# Patient Record
Sex: Male | Born: 1995 | Race: Black or African American | Hispanic: No | Marital: Single | Smoking: Never smoker
Health system: Southern US, Community
[De-identification: ages and names within clinical notes are randomized; demographics above are authoritative.]

## PROBLEM LIST (undated history)

## (undated) DIAGNOSIS — R7689 Other specified abnormal immunological findings in serum: Secondary | ICD-10-CM

## (undated) DIAGNOSIS — R768 Other specified abnormal immunological findings in serum: Secondary | ICD-10-CM

## (undated) DIAGNOSIS — R1013 Epigastric pain: Secondary | ICD-10-CM

## (undated) DIAGNOSIS — R3589 Other polyuria: Secondary | ICD-10-CM

## (undated) DIAGNOSIS — K219 Gastro-esophageal reflux disease without esophagitis: Secondary | ICD-10-CM

## (undated) DIAGNOSIS — I499 Cardiac arrhythmia, unspecified: Secondary | ICD-10-CM

## (undated) DIAGNOSIS — I10 Essential (primary) hypertension: Secondary | ICD-10-CM

## (undated) DIAGNOSIS — B009 Herpesviral infection, unspecified: Secondary | ICD-10-CM

## (undated) HISTORY — DX: Epigastric pain: R10.13

## (undated) HISTORY — DX: Essential (primary) hypertension: I10

## (undated) HISTORY — DX: Cardiac arrhythmia, unspecified: I49.9

## (undated) HISTORY — DX: Other polyuria: R35.89

## (undated) HISTORY — DX: Gastro-esophageal reflux disease without esophagitis: K21.9

## (undated) HISTORY — DX: Herpesviral infection, unspecified: B00.9

## (undated) HISTORY — DX: Other specified abnormal immunological findings in serum: R76.8

## (undated) HISTORY — DX: Other specified abnormal immunological findings in serum: R76.89

---
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
## 2019-03-14 ENCOUNTER — Other Ambulatory Visit: Payer: Self-pay

## 2019-03-14 DIAGNOSIS — Z20822 Contact with and (suspected) exposure to covid-19: Secondary | ICD-10-CM

## 2019-03-16 LAB — NOVEL CORONAVIRUS, NAA: SARS-CoV-2, NAA: NOT DETECTED

## 2019-06-05 ENCOUNTER — Ambulatory Visit: Payer: Medicare HMO | Attending: Internal Medicine

## 2019-06-05 ENCOUNTER — Other Ambulatory Visit: Payer: Self-pay

## 2019-06-05 DIAGNOSIS — Z20822 Contact with and (suspected) exposure to covid-19: Secondary | ICD-10-CM

## 2019-06-06 LAB — NOVEL CORONAVIRUS, NAA: SARS-CoV-2, NAA: NOT DETECTED

## 2020-11-13 ENCOUNTER — Other Ambulatory Visit: Payer: Self-pay

## 2020-11-13 ENCOUNTER — Emergency Department (HOSPITAL_COMMUNITY)
Admission: EM | Admit: 2020-11-13 | Discharge: 2020-11-13 | Disposition: A | Discharge status: Home / Self Care | Payer: PRIVATE HEALTH INSURANCE | Attending: Emergency Medicine | Admitting: Emergency Medicine

## 2020-11-13 ENCOUNTER — Encounter (HOSPITAL_COMMUNITY): Payer: Self-pay | Admitting: Emergency Medicine

## 2020-11-13 DIAGNOSIS — R002 Palpitations: Secondary | ICD-10-CM | POA: Diagnosis present

## 2020-11-13 DIAGNOSIS — I493 Ventricular premature depolarization: Secondary | ICD-10-CM | POA: Diagnosis not present

## 2020-11-13 DIAGNOSIS — D72819 Decreased white blood cell count, unspecified: Secondary | ICD-10-CM | POA: Diagnosis not present

## 2020-11-13 LAB — CBC WITH DIFFERENTIAL/PLATELET
Abs Immature Granulocytes: 0 10*3/uL (ref 0.00–0.07)
Basophils Absolute: 0 10*3/uL (ref 0.0–0.1)
Basophils Relative: 1 %
Eosinophils Absolute: 0.1 10*3/uL (ref 0.0–0.5)
Eosinophils Relative: 3 %
HCT: 44.3 % (ref 39.0–52.0)
Hemoglobin: 14.8 g/dL (ref 13.0–17.0)
Immature Granulocytes: 0 %
Lymphocytes Relative: 40 %
Lymphs Abs: 1.2 10*3/uL (ref 0.7–4.0)
MCH: 28.5 pg (ref 26.0–34.0)
MCHC: 33.4 g/dL (ref 30.0–36.0)
MCV: 85.2 fL (ref 80.0–100.0)
Monocytes Absolute: 0.4 10*3/uL (ref 0.1–1.0)
Monocytes Relative: 15 %
Neutro Abs: 1.3 10*3/uL — ABNORMAL LOW (ref 1.7–7.7)
Neutrophils Relative %: 41 %
Platelets: 148 10*3/uL — ABNORMAL LOW (ref 150–400)
RBC: 5.2 MIL/uL (ref 4.22–5.81)
RDW: 11.9 % (ref 11.5–15.5)
WBC: 2.9 10*3/uL — ABNORMAL LOW (ref 4.0–10.5)
nRBC: 0 % (ref 0.0–0.2)

## 2020-11-13 LAB — COMPREHENSIVE METABOLIC PANEL
ALT: 24 U/L (ref 0–44)
AST: 21 U/L (ref 15–41)
Albumin: 4.4 g/dL (ref 3.5–5.0)
Alkaline Phosphatase: 78 U/L (ref 38–126)
Anion gap: 5 (ref 5–15)
BUN: 10 mg/dL (ref 6–20)
CO2: 29 mmol/L (ref 22–32)
Calcium: 9 mg/dL (ref 8.9–10.3)
Chloride: 103 mmol/L (ref 98–111)
Creatinine, Ser: 1.07 mg/dL (ref 0.61–1.24)
GFR, Estimated: >60 mL/min (ref >60)
Glucose, Bld: 88 mg/dL (ref 70–99)
Potassium: 3.9 mmol/L (ref 3.5–5.1)
Sodium: 137 mmol/L (ref 135–145)
Total Bilirubin: 1 mg/dL (ref 0.3–1.2)
Total Protein: 7.5 g/dL (ref 6.5–8.1)

## 2020-11-13 LAB — MAGNESIUM: Magnesium: 2.1 mg/dL (ref 1.7–2.4)

## 2020-11-13 NOTE — Discharge Instructions (Addendum)
Your testing has not revealed any specific abnormalities other than a slightly low white blood cell count, this measured at 2900 which is slightly low.  I would recommend that you have this followed up within the next 2 weeks.  If you should develop worsening palpitations chest pain shortness of breath or weakness return to the emergency department  Make sure you are drinking at least 64 ounces of water per day

## 2020-11-13 NOTE — ED Triage Notes (Signed)
Pt c/o having palpitations today. Also c/o that his extremities fell cold and having some bilateral numbness.

## 2020-11-13 NOTE — ED Provider Notes (Signed)
[facility] EMERGENCY DEPARTMENT Provider Note   CSN: [id number] Arrival date & time: 11/13/20  2143     History Chief Complaint  Patient presents with   Palpitations    Peter Durham is a 25 y.o. male.   Palpitations  This patient is a 25 year old male, he has no prior medical history, he was in his usual state of health when he felt like his heart was having some high palpitations.  The patient denies having anything like this in the past, he states that he plays soccer once a week or so and never has any issues with that other than feeling like he sweats very heavily.  He drinks approximately half of a gallon of water per day, he does not use any caffeine, does not use any drugs, does not take any over-the-counter or prescription medications and has never had any issues with his heart, according to the father who is an additional historian and a physician there is no history of prior early coronary disease or sudden cardiac death in the family.  This patient denies any swelling of the legs, fever, chills, nausea, vomiting, diarrhea, he has not had any unexpected weight loss weight gain or changes in his hair.  History reviewed. No pertinent past medical history.  There are no problems to display for this patient.   History reviewed. No pertinent surgical history.     Family History  Problem Relation Age of Onset   Hypertension Father    Hypertension Paternal Grandmother     Social History   Tobacco Use   Smoking status: Never   Smokeless tobacco: Never  Substance Use Topics   Alcohol use: No   Drug use: No    Home Medications Prior to Admission medications   Not on File    Allergies    Patient has no known allergies.  Review of Systems   Review of Systems  Cardiovascular:  Positive for palpitations.  All other systems reviewed and are negative.  Physical Exam Updated Vital Signs BP 123/77   Pulse (!) 59   Temp 98.5 F (36.9 C) (Oral)   Resp 16   Ht  1.803 m (5\' 11" )   Wt 86.5 kg   SpO2 100%   BMI 26.60 kg/m   Physical Exam Vitals and nursing note reviewed.  Constitutional:      General: He is not in acute distress.    Appearance: He is well-developed.  HENT:     Head: Normocephalic and atraumatic.     Mouth/Throat:     Pharynx: No oropharyngeal exudate.  Eyes:     General: No scleral icterus.       Right eye: No discharge.        Left eye: No discharge.     Conjunctiva/sclera: Conjunctivae normal.     Pupils: Pupils are equal, round, and reactive to light.  Neck:     Thyroid: No thyromegaly.     Vascular: No JVD.  Cardiovascular:     Rate and Rhythm: Normal rate and regular rhythm.     Heart sounds: Normal heart sounds. No murmur heard.   No friction rub. No gallop.     Comments: Occasional ectopic beat which correlates with a PVC on the monitor Pulmonary:     Effort: Pulmonary effort is normal. No respiratory distress.     Breath sounds: Normal breath sounds. No wheezing or rales.  Abdominal:     General: Bowel sounds are normal. There is no distension.  Palpations: Abdomen is soft. There is no mass.     Tenderness: There is no abdominal tenderness.  Musculoskeletal:        General: No tenderness. Normal range of motion.     Cervical back: Normal range of motion and neck supple.  Lymphadenopathy:     Cervical: No cervical adenopathy.  Skin:    General: Skin is warm and dry.     Findings: No erythema or rash.  Neurological:     General: No focal deficit present.     Mental Status: He is alert.     Coordination: Coordination normal.     Comments: Normal reflexes at the patellar tendons, normal strength in all 4 extremities, normal speech, normal coordination, normal gait  Psychiatric:        Behavior: Behavior normal.    ED Results / Procedures / Treatments   Labs (all labs ordered are listed, but only abnormal results are displayed) Labs Reviewed  CBC WITH DIFFERENTIAL/PLATELET - Abnormal; Notable for  the following components:      Result Value   WBC 2.9 (*)    Platelets 148 (*)    Neutro Abs 1.3 (*)    All other components within normal limits  COMPREHENSIVE METABOLIC PANEL  MAGNESIUM  TSH    EKG EKG Interpretation  Date/Time:  Thursday November 13 2020 21:59:33 EDT Ventricular Rate:  57 PR Interval:  139 QRS Duration: 102 QT Interval:  401 QTC Calculation: 391 R Axis:   84 Text Interpretation: Sinus rhythm ST elev, probable normal early repol pattern No old tracing to compare Confirmed by Eber Hong ([id number]) on 11/13/2020 10:12:13 PM  Radiology No results found.  Procedures Procedures   Medications Ordered in ED Medications - No data to display  ED Course  I have reviewed the triage vital signs and the nursing notes.  Pertinent labs & imaging results that were available during my care of the patient were reviewed by me and considered in my medical decision making (see chart for details).    MDM Rules/Calculators/A&P                          Labs have been ordered including a TSH, metabolic panel, CBC to rule out anemia low electrolytes or thyroid abnormalities.  His EKG is reassuring and shows a sinus rhythm without any signs of ischemia or arrhythmia.  It is clear that he is having occasional PVCs on the monitor which she feels as we talked.  He states this is what he has been feeling.  The patient has a rather normal work-up other than a mild leukopenia.  He has been informed of this and will need to have this followed up.  Vital signs otherwise unremarkable, TSH pending at discharge, patient aware that this can be followed up in the outpatient setting, agreeable  No pathological arrhythmias have been seen on the monitor  Final Clinical Impression(s) / ED Diagnoses Final diagnoses:  Palpitations  Leukopenia, unspecified type  PVC's (premature ventricular contractions)    Rx / DC Orders ED Discharge Orders     None        Eber Hong, MD 11/13/20  2311

## 2020-11-14 LAB — TSH: TSH: 3.487 u[IU]/mL (ref 0.350–4.500)

## 2021-01-07 DIAGNOSIS — D709 Neutropenia, unspecified: Secondary | ICD-10-CM | POA: Insufficient documentation

## 2021-03-17 DIAGNOSIS — R350 Frequency of micturition: Secondary | ICD-10-CM | POA: Diagnosis not present

## 2021-05-18 DIAGNOSIS — R7989 Other specified abnormal findings of blood chemistry: Secondary | ICD-10-CM | POA: Diagnosis not present

## 2021-05-18 DIAGNOSIS — R350 Frequency of micturition: Secondary | ICD-10-CM | POA: Diagnosis not present

## 2021-05-21 ENCOUNTER — Telehealth: Payer: Self-pay | Admitting: "Endocrinology

## 2021-05-21 NOTE — Telephone Encounter (Signed)
Called pt to schedule a new pt appt with Dr Fransico Him. He is on break for the next 3-4 weeks per the staff message that was sent to me from Dr Fransico Him. His VM was full so I was unable to leave a VM

## 2021-06-23 ENCOUNTER — Ambulatory Visit (INDEPENDENT_AMBULATORY_CARE_PROVIDER_SITE_OTHER): Payer: Self-pay | Admitting: "Endocrinology

## 2021-06-23 ENCOUNTER — Other Ambulatory Visit: Payer: Self-pay

## 2021-06-23 ENCOUNTER — Encounter: Payer: Self-pay | Admitting: "Endocrinology

## 2021-06-23 ENCOUNTER — Telehealth: Payer: Self-pay | Admitting: "Endocrinology

## 2021-06-23 VITALS — BP 114/72 | HR 60 | Ht 71.5 in | Wt 210.0 lb

## 2021-06-23 DIAGNOSIS — Z87898 Personal history of other specified conditions: Secondary | ICD-10-CM

## 2021-06-23 NOTE — Progress Notes (Signed)
Endocrinology Consult Note                                            06/23/2021, 12:44 PM   Subjective:    Patient ID: Peter Durham, male    DOB: Jun 05, 1996, PCP Carrico, Lynann Bologna, NP   History reviewed. No pertinent past medical history. History reviewed. No pertinent surgical history. Social History   Socioeconomic History   Marital status: Single    Spouse name: Not on file   Number of children: Not on file   Years of education: Not on file   Highest education level: Not on file  Occupational History   Not on file  Tobacco Use   Smoking status: Never   Smokeless tobacco: Never  Vaping Use   Vaping Use: Never used  Substance and Sexual Activity   Alcohol use: No   Drug use: No   Sexual activity: Never    Birth control/protection: Abstinence  Other Topics Concern   Not on file  Social History Narrative   Not on file   Social Determinants of Health   Financial Resource Strain: Not on file  Food Insecurity: Not on file  Transportation Needs: Not on file  Physical Activity: Not on file  Stress: Not on file  Social Connections: Not on file   Family History  Problem Relation Age of Onset   Thyroid disease Mother    Hypertension Father    Hypertension Paternal Grandmother    No outpatient encounter medications on file as of 06/23/2021.   No facility-administered encounter medications on file as of 06/23/2021.   ALLERGIES: No Known Allergies  VACCINATION STATUS: Immunization History  Administered Date(s) Administered   DTaP 01/20/1996, 03/16/1996, 07/03/1996, 02/22/1997, 01/17/2002   Hepatitis B 12/01/1995, 07/03/1996   HiB (PRP-OMP) 01/20/1996, 03/16/1996, 07/03/1996, 02/22/1997   IPV 01/20/1996, 03/16/1996, 02/22/1997   MMR 02/22/1997   Meningococcal Conjugate 02/28/2008, 12/05/2013   Meningococcal Polysaccharide 02/28/2008   Moderna Sars-Covid-2 Vaccination 10/03/2019, 11/03/2019   Tdap 01/11/2007   Varicella 10/19/1996    HPI Peter Durham is 26 y.o. male who presents today with a medical history as above. he is being seen in consultation for polyuria requested by Peter Sickle, NP.    History is obtained directly from the patient as well as chart review.  He reports that around June - July 2022 he was experiencing frequent urination associated with polydipsia.  At peak times he was drinking up to 6-8 liters a day with urinating anywhere from  every 30 to 60 minutes. Subsequently, he adjusted his diet by removing sweeteners, coffee, as well as chocolate and he saw that his symptoms have improved.  Currently he drinks and urinates up to 2 L a day.  He never had documented hyponatremia.  He denies any history of head injury or exposure to lithium. 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 He does not have diabetes mellitus.  He is not on medications on regular basis.  He is a non-smoker.  No use of illicit drugs.  He denies any other chronic medical problems such as liver disease nor kidney disease.  He noticed recent increase in his body weight by more than 10 pounds since summer 2022.    Review of Systems  Constitutional: + Recent weight gain,  no fatigue, no subjective hyperthermia, no subjective  hypothermia Eyes: no blurry vision, no xerophthalmia ENT: no sore throat, no nodules palpated in throat, no dysphagia/odynophagia, no hoarseness Cardiovascular: no Chest Pain, no Shortness of Breath, no palpitations, no leg swelling Respiratory: no cough, no shortness of breath Gastrointestinal: no Nausea/Vomiting/Diarhhea Musculoskeletal: no muscle/joint aches Skin: no rashes Neurological: no tremors, no numbness, no tingling, no dizziness Psychiatric: no depression, no anxiety  Objective:    Vitals with BMI 06/23/2021 11/13/2020 11/13/2020  Height 5' 11.5" - -  Weight 210 lbs - -  BMI 34.28 - -  Systolic 768 115 726  Diastolic 72 77 79  Pulse 60 59 55    BP 114/72    Pulse 60    Ht 5'  11.5" (1.816 m)    Wt 210 lb (95.3 kg)    BMI 28.88 kg/m   Wt Readings from Last 3 Encounters:  06/23/21 210 lb (95.3 kg)  11/13/20 190 lb 11.2 oz (86.5 kg)  12/05/13 171 lb (77.6 kg) (79 %, Z= 0.80)*   * Growth percentiles are based on CDC (Boys, 2-20 Years) data.    Physical Exam  Constitutional:  Body mass index is 28.88 kg/m.,  not in acute distress, normal state of mind Eyes: PERRLA, EOMI, no exophthalmos ENT: moist mucous membranes, no gross thyromegaly, no gross cervical lymphadenopathy  Musculoskeletal: no gross deformities, strength intact in all four extremities Skin: moist, warm, no rashes Neurological: no tremor with outstretched hands, Deep tendon reflexes normal in bilateral lower extremities.  CMP ( most recent) CMP     Component Value Date/Time   NA 137 11/13/2020 2235   K 3.9 11/13/2020 2235   CL 103 11/13/2020 2235   CO2 29 11/13/2020 2235   GLUCOSE 88 11/13/2020 2235   BUN 10 11/13/2020 2235   CREATININE 1.07 11/13/2020 2235   CALCIUM 9.0 11/13/2020 2235   PROT 7.5 11/13/2020 2235   ALBUMIN 4.4 11/13/2020 2235   AST 21 11/13/2020 2235   ALT 24 11/13/2020 2235   ALKPHOS 78 11/13/2020 2235   BILITOT 1.0 11/13/2020 2235   GFRNONAA >60 11/13/2020 2235       Lab Results  Component Value Date   TSH 3.487 11/13/2020           Assessment & Plan:   1. History of polyuria  - Peter Durham  is being seen at a kind request of Carrico, Lynann Bologna, NP. - I have reviewed his available  records and clinically evaluated the patient. - Based on these reviews, he has history of polyuria associated with polydipsia however,  there is not sufficient information to proceed with definitive treatment plan.  He does not have documented hyponatremia.  His urine output currently is estimated to be up to 2 L a day and he is drinking as much.  - he will need a repeat,  more complete labs towards making a diagnosis.  At this time, it appears that he has experienced  solute diuresis as opposed to water diuresis during last summer which has responded to his modification of his diet  with less sugar, chocolate, caffeine.    Unlikely that he has central DI, however he will have urine electrolytes, serum electrolytes, urine osmolality, serum osmolality.  He is not a suspect for nephrogenic DI either  - he acknowledges that there is a room for improvement in his food and drink choices. - Suggestion is made for him to avoid simple carbohydrates  from his diet including Cakes, Sweet Desserts, Ice Cream, Soda (diet and regular),  Sweet Tea, Candies, Chips, Cookies, Store Bought Juices, Alcohol , Artificial Sweeteners,  Coffee Creamer, and "Sugar-free" Products, Lemonade. This will help patient to have more stable body weight and avoid unintended weight gain.     Whole Foods, Plant-Based Nutrition comprising of fruits and vegetables, plant-based proteins, whole-grain carbohydrates was discussed in detail with the patient.   A list for source of those nutrients were also provided to the patient.  Patient will use only water or unsweetened tea for hydration.  Optimal exercise was discussed with the patient .  - I did not initiate any new prescriptions today. - he is advised to maintain close follow up with Carrico, Lynann Bologna, NP for primary care needs.   - Time spent with the patient: 50 minutes, of which >50% was spent in  counseling him about his polyuria and the rest in obtaining information about his symptoms, reviewing his previous labs/studies ( including abstractions from other facilities),  evaluations, and treatments,  and developing a plan to confirm diagnosis and long term treatment based on the latest standards of care/guidelines; and documenting his care.  Peter Durham participated in the discussions, expressed understanding, and voiced agreement with the above plans.  All questions were answered to his satisfaction. he is encouraged to contact clinic should he  have any questions or concerns prior to his return visit.  Follow up plan: Return in about 2 weeks (around 07/07/2021) for F/U with Pre-visit Labs.   Glade Lloyd, MD [facility] [address] [place], [place] [address] Phone: [phone]  Fax: [phone]     06/23/2021, 12:44 PM  This note was partially dictated with voice recognition software. Similar sounding words can be transcribed inadequately or may not  be corrected upon review.

## 2021-06-23 NOTE — Telephone Encounter (Signed)
Pt had a first time visit today with Dr Fransico Him. The insurance he provided me was [employer]. I ran this insurance several times with the ID numbers that he provided to me. It continuously said inactive. I made patient aware. Patient made it known to me that online it said his insurance was active. I tried to call pt's insurance and was unable to get through, long wait time. Patient was advised to call them. He called [place] around [phone] to let us know they said insurance was active. We are not able to add the insurance being " inactive ". Patient made aware.

## 2021-06-30 ENCOUNTER — Other Ambulatory Visit: Payer: Self-pay | Admitting: "Endocrinology

## 2021-06-30 DIAGNOSIS — Z87898 Personal history of other specified conditions: Secondary | ICD-10-CM | POA: Diagnosis not present

## 2021-07-03 LAB — COMPREHENSIVE METABOLIC PANEL
ALT: 24 IU/L (ref 0–44)
AST: 18 IU/L (ref 0–40)
Albumin/Globulin Ratio: 2 (ref 1.2–2.2)
Albumin: 4.6 g/dL (ref 4.1–5.2)
Alkaline Phosphatase: 94 IU/L (ref 44–121)
BUN/Creatinine Ratio: 7 — ABNORMAL LOW (ref 9–20)
BUN: 9 mg/dL (ref 6–20)
Bilirubin Total: 1.1 mg/dL (ref 0.0–1.2)
CO2: 26 mmol/L (ref 20–29)
Calcium: 9.5 mg/dL (ref 8.7–10.2)
Chloride: 104 mmol/L (ref 96–106)
Creatinine, Ser: 1.3 mg/dL — ABNORMAL HIGH (ref 0.76–1.27)
Globulin, Total: 2.3 g/dL (ref 1.5–4.5)
Glucose: 90 mg/dL (ref 70–99)
Potassium: 4.5 mmol/L (ref 3.5–5.2)
Sodium: 142 mmol/L (ref 134–144)
Total Protein: 6.9 g/dL (ref 6.0–8.5)
eGFR: 78 mL/min/{1.73_m2} (ref >59)

## 2021-07-03 LAB — OSMOLALITY, URINE: Osmolality, Ur: 329 mOsmol/kg

## 2021-07-03 LAB — CREATININE, URINE, 24 HOUR
Creatinine, 24H Ur: 1576 mg/24 hr (ref 1000–2000)
Creatinine, Urine: 78.8 mg/dL

## 2021-07-03 LAB — POTASSIUM, URINE, 24 HOUR
Potassium Urine: 31.2 mmol/L
Potassium, Urine: 62 mmol/24 hr (ref 20–116)

## 2021-07-03 LAB — SODIUM, URINE, 24 HOUR
Sodium, 24H Ur: 124 mmol/24 hr (ref 58–337)
Sodium, Ur: 62 mmol/L

## 2021-07-03 LAB — CHLORIDE, URINE, TIMED
Chloride Urine: 106 mmol/24 hr (ref 52–264)
Chloride, Ur: 53 mmol/L

## 2021-07-03 LAB — OSMOLALITY: Osmolality Meas: 289 mOsmol/kg (ref 275–295)

## 2021-07-07 ENCOUNTER — Ambulatory Visit: Payer: BC Managed Care – PPO | Admitting: "Endocrinology

## 2021-07-07 ENCOUNTER — Other Ambulatory Visit: Payer: Self-pay

## 2021-07-07 ENCOUNTER — Encounter: Payer: Self-pay | Admitting: "Endocrinology

## 2021-07-07 VITALS — BP 120/74 | HR 60 | Ht 71.5 in | Wt 211.0 lb

## 2021-07-07 DIAGNOSIS — Z87898 Personal history of other specified conditions: Secondary | ICD-10-CM | POA: Diagnosis not present

## 2021-07-07 NOTE — Progress Notes (Signed)
07/07/2021, 9:57 AM  Endocrinology follow-up note   Subjective:    Patient ID: Peter Durham, male    DOB: Sep 28, 1995, PCP Carrico, Lynann Bologna, NP   History reviewed. No pertinent past medical history. History reviewed. No pertinent surgical history. Social History   Socioeconomic History   Marital status: Single    Spouse name: Not on file   Number of children: Not on file   Years of education: Not on file   Highest education level: Not on file  Occupational History   Not on file  Tobacco Use   Smoking status: Never   Smokeless tobacco: Never  Vaping Use   Vaping Use: Never used  Substance and Sexual Activity   Alcohol use: No   Drug use: No   Sexual activity: Never    Birth control/protection: Abstinence  Other Topics Concern   Not on file  Social History Narrative   Not on file   Social Determinants of Health   Financial Resource Strain: Not on file  Food Insecurity: Not on file  Transportation Needs: Not on file  Physical Activity: Not on file  Stress: Not on file  Social Connections: Not on file   Family History  Problem Relation Age of Onset   Thyroid disease Mother    Hypertension Father    Hypertension Paternal Grandmother    No outpatient encounter medications on file as of 07/07/2021.   No facility-administered encounter medications on file as of 07/07/2021.   ALLERGIES: No Known Allergies  VACCINATION STATUS: Immunization History  Administered Date(s) Administered   DTaP 01/20/1996, 03/16/1996, 07/03/1996, 02/22/1997, 01/17/2002   Hepatitis B 12/01/1995, 07/03/1996   HiB (PRP-OMP) 01/20/1996, 03/16/1996, 07/03/1996, 02/22/1997   IPV 01/20/1996, 03/16/1996, 02/22/1997   MMR 02/22/1997   Meningococcal Conjugate 02/28/2008, 12/05/2013   Meningococcal Polysaccharide 02/28/2008   Moderna Sars-Covid-2 Vaccination 10/03/2019, 11/03/2019   Tdap 01/11/2007   Varicella 10/19/1996     HPI Peter Durham is 26 y.o. male who presents today with a medical history as above. he is being seen in follow-up after he was seen in consultation for polyuria requested by Nita Sickle, NP.   See notes from his previous visit.  He has no new complaints today.  He returns to review his lab results.  History is obtained directly from the patient as well as chart review.  He reports that around June - July 2022 he was experiencing frequent urination associated with polydipsia.  At peak times he was drinking up to 6-8 liters a day with urinating anywhere from  every 30 to 60 minutes. Subsequently, he adjusted his diet by removing sweeteners, coffee, as well as chocolate and he saw that his symptoms have improved.  Currently he drinks 32-64 ounces daily and urinates up to 2 L a day.  He never had documented hypernatremia.  He denies any history of head injury or exposure to lithium. 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  He is not on medications on regular basis.  He is a non-smoker.  No use of illicit drugs.  He denies any other chronic medical problems such as liver disease nor kidney disease.  He noticed recent increase  in his body weight by more than 10 pounds since summer 2022.  He maintains a steady weight since last visit.    Review of Systems  Constitutional: + Recent weight gain,  no fatigue, no subjective hyperthermia, no subjective hypothermia   Objective:    Vitals with BMI 07/07/2021 06/23/2021 11/13/2020  Height 5' 11.5" 5' 11.5" -  Weight 211 lbs 210 lbs -  BMI 01.60 10.93 -  Systolic 235 573 220  Diastolic 74 72 77  Pulse 60 60 59    BP 120/74    Pulse 60    Ht 5' 11.5" (1.816 m)    Wt 211 lb (95.7 kg)    BMI 29.02 kg/m   Wt Readings from Last 3 Encounters:  07/07/21 211 lb (95.7 kg)  06/23/21 210 lb (95.3 kg)  11/13/20 190 lb 11.2 oz (86.5 kg)    Physical Exam  Constitutional:  Body  mass index is 29.02 kg/m.,  not in acute distress, normal state of mind Eyes: PERRLA, EOMI, no exophthalmos ENT: moist mucous membranes, no gross thyromegaly, no gross cervical lymphadenopathy   CMP ( most recent)  Recent Results (from the past 2160 hour(s))  Comprehensive metabolic panel     Status: Abnormal   Collection Time: 06/30/21  8:59 AM  Result Value Ref Range   Glucose 90 70 - 99 mg/dL   BUN 9 6 - 20 mg/dL   Creatinine, Ser 1.30 (H) 0.76 - 1.27 mg/dL   eGFR 78 >59 mL/min/1.73   BUN/Creatinine Ratio 7 (L) 9 - 20   Sodium 142 134 - 144 mmol/L   Potassium 4.5 3.5 - 5.2 mmol/L   Chloride 104 96 - 106 mmol/L   CO2 26 20 - 29 mmol/L   Calcium 9.5 8.7 - 10.2 mg/dL   Total Protein 6.9 6.0 - 8.5 g/dL   Albumin 4.6 4.1 - 5.2 g/dL   Globulin, Total 2.3 1.5 - 4.5 g/dL   Albumin/Globulin Ratio 2.0 1.2 - 2.2   Bilirubin Total 1.1 0.0 - 1.2 mg/dL   Alkaline Phosphatase 94 44 - 121 IU/L   AST 18 0 - 40 IU/L   ALT 24 0 - 44 IU/L  Creatinine, urine, 24 hour     Status: None   Collection Time: 06/30/21  8:59 AM  Result Value Ref Range   Creatinine, Urine 78.8 Not Estab. mg/dL   Creatinine, 24H Ur 1,576 1,000 - 2,000 mg/24 hr  Chloride, urine, timed     Status: None   Collection Time: 06/30/21  8:59 AM  Result Value Ref Range   Chloride, Ur 53 Not Estab. mmol/L   Chloride Urine 106 52 - 264 mmol/24 hr  Sodium, urine, 24 hour     Status: None   Collection Time: 06/30/21  8:59 AM  Result Value Ref Range   Sodium, Ur 62 Not Estab. mmol/L   Sodium, 24H Ur 124 58 - 337 mmol/24 hr  Potassium, urine, 24 hour     Status: None   Collection Time: 06/30/21  8:59 AM  Result Value Ref Range   Potassium Urine 31.2 Not Estab. mmol/L   Potassium, Urine 62 20 - 116 mmol/24 hr  Osmolality     Status: None   Collection Time: 06/30/21  8:59 AM  Result Value Ref Range   Osmolality Meas 289 275 - 295 mOsmol/kg  Osmolality, urine     Status: None   Collection Time: 06/30/21  8:59 AM  Result  Value Ref Range   Osmolality, Ur  329 mOsmol/kg    Comment:                                   24 hr :   300 -  900                                   Random:    50 - 1400                                   After 12hr fluid                                   restriction:    >850        Lab Results  Component Value Date   TSH 3.487 11/13/2020           Assessment & Plan:   1. History of polyuria  I reviewed his existing and new results with him.  He has no new complaints since last visit.  His current water consumption is from 32-64 ounces daily.  Urine output approximately 2 L a day.  His serum electrolytes, urine electrolytes, serum osmolality, urine osmolality are all within normal limits.  - Based on these reviews, he does not have clinical nor biochemical evidence of diabetes insipidus.     At this time, it appears that, at the beginning of his course,   he had experienced solute diuresis as opposed to water diuresis during last summer which has responded to his modification of his diet  with less sugar, chocolate, caffeine.     - he acknowledges that there is a room for improvement in his food and drink choices. - Suggestion is made for him to avoid simple carbohydrates  from his diet including Cakes, Sweet Desserts, Ice Cream, Soda (diet and regular), Sweet Tea, Candies, Chips, Cookies, Store Bought Juices, Alcohol , Artificial Sweeteners,  Coffee Creamer, and "Sugar-free" Products, Lemonade. This will help patient to have more stable body weight and avoid unintended weight gain.     Whole Foods, Plant-Based Nutrition comprising of fruits and vegetables, plant-based proteins, whole-grain carbohydrates was discussed in detail with the patient.   A list for source of those nutrients were also provided to the patient.  Patient will use only water or unsweetened tea for hydration.  Optimal exercise was discussed with the patient .  Regarding his mildly elevated serum creatinine, he is  advised to avoid over-the-counter nonsteroidal anti-inflammatory medications such as ibuprofen, Aleve.  He is advised to maintain adequate hydration.  He will need repeat CMP in 6 months with office visit.  - I did not initiate any new prescriptions today. - he is advised to maintain close follow up with Carrico, Lynann Bologna, NP for primary care needs.    I spent 25 minutes in the care of the patient today including review of labs from Thyroid Function, CMP, and other relevant labs ; imaging/biopsy records (current and previous including abstractions from other facilities); face-to-face time discussing  his lab results and symptoms, medications doses, his options of short and long term treatment based on the latest standards of care / guidelines;   and documenting the encounter.  Peter Durham  participated  in the discussions, expressed understanding, and voiced agreement with the above plans.  All questions were answered to his satisfaction. he is encouraged to contact clinic should he have any questions or concerns prior to his return visit.   Follow up plan: Return in about 6 months (around 01/04/2022) for F/U with Pre-visit Labs.   Glade Lloyd, MD [facility] [address] [place], [place] [address] Phone: [phone]  Fax: [phone]     07/07/2021, 9:57 AM  This note was partially dictated with voice recognition software. Similar sounding words can be transcribed inadequately or may not  be corrected upon review.

## 2021-08-11 ENCOUNTER — Encounter: Payer: Self-pay | Admitting: "Endocrinology

## 2021-08-12 DIAGNOSIS — R200 Anesthesia of skin: Secondary | ICD-10-CM | POA: Diagnosis not present

## 2021-08-12 DIAGNOSIS — R109 Unspecified abdominal pain: Secondary | ICD-10-CM | POA: Diagnosis not present

## 2021-08-12 DIAGNOSIS — R768 Other specified abnormal immunological findings in serum: Secondary | ICD-10-CM | POA: Diagnosis not present

## 2021-08-12 DIAGNOSIS — G8929 Other chronic pain: Secondary | ICD-10-CM | POA: Diagnosis not present

## 2021-08-17 ENCOUNTER — Other Ambulatory Visit: Payer: Self-pay

## 2021-08-17 ENCOUNTER — Ambulatory Visit: Payer: BC Managed Care – PPO | Admitting: Allergy & Immunology

## 2021-08-17 ENCOUNTER — Encounter: Payer: Self-pay | Admitting: Allergy & Immunology

## 2021-08-17 VITALS — BP 108/82 | HR 59 | Temp 99.5°F | Resp 18 | Ht 71.0 in | Wt 206.4 lb

## 2021-08-17 DIAGNOSIS — R3589 Other polyuria: Secondary | ICD-10-CM

## 2021-08-17 DIAGNOSIS — T781XXA Other adverse food reactions, not elsewhere classified, initial encounter: Secondary | ICD-10-CM

## 2021-08-17 DIAGNOSIS — K9049 Malabsorption due to intolerance, not elsewhere classified: Secondary | ICD-10-CM

## 2021-08-17 NOTE — Progress Notes (Incomplete)
NEW PATIENT  Date of Service/Encounter:  08/17/21  Consult requested by: Peter Sickle, NP   Assessment:   No diagnosis found.  Plan/Recommendations:    There are no Patient Instructions on file for this visit.   {Blank single:19197::"This note in its entirety was forwarded to the Provider who requested this consultation."}  Subjective:   Peter Durham is a 26 y.o. male presenting today for evaluation of  Chief Complaint  Patient presents with   Food Intolerance    Says he believes he is allergic to gluten. Wants to see if anything else he consumes he is allergic to.     Peter Durham has a history of the following: Patient Active Problem List   Diagnosis Date Noted   History of polyuria 06/23/2021    History obtained from: chart review and {Persons; PED relatives w/patient:19415::"patient"}.  Peter Durham was referred by Peter Sickle, NP.     Peter Durham is a 26 y.o. male presenting for {Blank single:19197::"a food challenge","a drug challenge","skin testing","a sick visit","an evaluation of ***","a follow up visit"}.  Since June 2022, he has not been well. He reports that he started having frequent urination and fatigue. HE has been unwell. He reports some metabolic issues. He has seen Endorcinology (Dr. Dorris Fetch). He did have Hematology evaluated. ANA was 1:40.   In June, he went to the hospital for palpitations. He was unable to stay hydrated.   He was talking to his PCP this past week. He noticed that removing gluten from his diet helped a lot. He did take some potassium out of his diet and took some calcium out of his diet. He did notice some improvement with his symptoms especially with the removal of the gluten. He cut out gluten out of his diet in November and he is much better. He still does not feel at 100%, so his main question is food allergies.   He has never had hives from eating any foods. He did have some throat swelling when he was 26 years  old. He is unsure what he ate, but it was something in enchiladas.   He does not eat much peanut butter. He noticed that it caused him to feel unwell especially with is abdominal region. He did not touch that. He does eat peanuts, almonds, and cashews. He rarely drinks cow's milk, but he does eat yogurt and less frequently cheese. He does eat tofu on the regularly. He mostly drinks almond milk. He is not much of a sesame drinker.    He is the middle of several children. All are in good health. He was born in [place] when his parnets were in training, but raised in [place].  Right knee muscle weakness. He was not consuming enough potasium. No internatioanl travel.   He did have a groin injury last year, otherwise no injuries. He also is physically active with soccer and skating. He did have COVID once Christmas 2022. He does tend to get colds, but he does not require antibiotics. He does not remember the last time that he got antibiotics at all. He just rolls through with antibiotics. No rashes or pruritis.   Otherwise, there is no history of other atopic diseases, including {Blank multiple:19196:o:"asthma","food allergies","drug allergies","environmental allergies","stinging insect allergies","eczema","urticaria","contact dermatitis"}. There is no significant infectious history. ***Vaccinations are up to date.    Past Medical History: Patient Active Problem List   Diagnosis Date Noted   History of polyuria 06/23/2021    Medication List:  Allergies as of  08/17/2021   No Known Allergies      Medication List        Accurate as of August 17, 2021  9:09 AM. If you have any questions, ask your nurse or doctor.          Vitamin C 100 MG Chew Chew 0.5 tablets by mouth daily.   Vitamin D3 10 MCG (400 UNIT) Chew Chew 0.5 tablets by mouth daily.        Birth History: {Blank single:19197::"non-contributory","born premature and spent time in the NICU","born at term without  complications"}  Developmental History: [name] has met all milestones on time. He has required no {Blank multiple:19196:a:"speech therapy","occupational therapy","physical therapy"}. ***non-contributory  Past Surgical History: History reviewed. No pertinent surgical history.   Family History: Family History  Problem Relation Age of Onset   Thyroid disease Mother    Hypertension Father    Hypertension Paternal Grandmother      Social History: Peter Durham lives at home with ***.  He lives in an apartment that is around 26 years old.  There is carpeting throughout the home.  He has electric heating and central cooling.  There are cats inside of the home.  There are no dust mite covers on the bedding.  There is no tobacco exposure.  He currently works as a Cytogeneticist for the past 3 months.  He is not exposed to fumes, chemicals, or dust.  He does not use a HEPA filter.  He does not live near an interstate or industrial area.  X-ray.  [name]  This is a really elderly We will  Sending he gets here early every day already however you   ROS     Objective:   Blood pressure 108/82, pulse (!) 59, temperature 99.5 F (37.5 C), temperature source Temporal, resp. rate 18, height 5' 11"  (1.803 m), weight 206 lb 6.4 oz (93.6 kg), SpO2 99 %. Body mass index is 28.79 kg/m.     Physical Exam   Diagnostic studies: {Blank single:19197::"none","deferred due to recent antihistamine use","labs sent instead"," "}  Spirometry: {Blank single:19197::"results normal (FEV1: ***%, FVC: ***%, FEV1/FVC: ***%)","results abnormal (FEV1: ***%, FVC: ***%, FEV1/FVC: ***%)"}.    {Blank single:19197::"Spirometry consistent with mild obstructive disease","Spirometry consistent with moderate obstructive disease","Spirometry consistent with severe obstructive disease","Spirometry consistent with possible restrictive disease","Spirometry consistent with mixed obstructive and restrictive disease","Spirometry  uninterpretable due to technique","Spirometry consistent with normal pattern"}. {Blank single:19197::"Albuterol/Atrovent nebulizer","Xopenex/Atrovent nebulizer","Albuterol nebulizer","Albuterol four puffs via MDI","Xopenex four puffs via MDI"} treatment given in clinic with {Blank single:19197::"significant improvement in FEV1 per ATS criteria","significant improvement in FVC per ATS criteria","significant improvement in FEV1 and FVC per ATS criteria","improvement in FEV1, but not significant per ATS criteria","improvement in FVC, but not significant per ATS criteria","improvement in FEV1 and FVC, but not significant per ATS criteria","no improvement"}.  Allergy Studies: {Blank single:19197::"none","labs sent instead"," "}    {Blank single:19197::"Allergy testing results were read and interpreted by myself, documented by clinical staff."," "}         Salvatore Marvel, MD [facility] [place] [facility]

## 2021-08-17 NOTE — Patient Instructions (Addendum)
1. Food intolerance ?- Testing to all of the foods was negative.  ?- Copy of testing results provided. ?- There is a the low positive predictive value of food allergy testing and hence the high possibility of false positives. ?- In contrast, food allergy testing has a high negative predictive value, therefore if testing is negative we can be relatively assured that they are indeed negative.  ?- Therefore you do not have an ALLERGY, but you still may have an INTOLERANCE. ?- I am going to review your other notes to see what else might be going on. ?- I will also send a note to Dr. Fransico Him as well. ?- You are TOO young to be having these kind of problems!  ? ?2. Follow up as needed. ? ? ?Please inform us of any Emergency Department visits, hospitalizations, or changes in symptoms. Call us before going to the ED for breathing or allergy symptoms since we might be able to fit you in for a sick visit. Feel free to contact us anytime with any questions, problems, or concerns. ? ?It was a pleasure to meet you today! ? ?Websites that have reliable patient information: ?1. American Academy of Asthma, Allergy, and Immunology: www.aaaai.org ?2. Food Allergy Research and Education (FARE): foodallergy.org ?3. Mothers of Asthmatics: http://www.asthmacommunitynetwork.org ?4. [employer] of Allergy, Asthma, and Immunology: MissingWeapons.ca ? ? ?COVID-19 Vaccine Information can be found at: PodExchange.nl For questions related to vaccine distribution or appointments, please email [email or web address] or call [phone].  ? ?We realize that you might be concerned about having an allergic reaction to the COVID19 vaccines. To help with that concern, WE ARE OFFERING THE COVID19 VACCINES IN OUR OFFICE! Ask the front desk for dates!  ? ? ? ??Like? [place] on Facebook and Instagram for our latest updates!  ?  ? ? ?A healthy democracy works best when [employer] participate! Make  sure you are registered to vote! If you have moved or changed any of your contact information, you will need to get this updated before voting! ? ?In some cases, you MAY be able to register to vote online: AromatherapyCrystals.be ? ? ? ? Food Adult Perc - 08/17/21 1000   ? ? Time Antigen Placed 0945   ? Allergen Manufacturer Waynette Buttery   ? Location Back   ? Number of allergen test 72   ?  Control-buffer 50% Glycerol Negative   ? Control-Histamine 1 mg/ml 3+   ? 1. Peanut Negative   ? 2. Soybean Negative   ? 3. Wheat Negative   ? 4. Sesame Negative   ? 5. Milk, cow Negative   ? 6. Egg White, Chicken Negative   ? 7. Casein Negative   ? 8. Shellfish Mix Negative   ? 9. Fish Mix Negative   ? 10. Cashew Negative   ? 11. Pecan Food Negative   ? 12. Walnut Food Negative   ? 13. Almond Negative   ? 14. Hazelnut Negative   ? 15. [place] nut Negative   ? 16. Coconut Negative   ? 17. Pistachio Negative   ? 18. Catfish Negative   ? 19. Bass Negative   ? 20. Trout Negative   ? 21. Tuna Negative   ? 22. Salmon Negative   ? 23. Flounder Negative   ? 24. Codfish Negative   ? 25. Shrimp Negative   ? 26. Crab Negative   ? 27. Lobster Negative   ? 28. Oyster Negative   ? 29. Scallops Negative   ? 30.  Barley Negative   ? 31. Oat  Negative   ? 32. Rye  Negative   ? 33. Hops Negative   ? 34. Rice Negative   ? 35. Cottonseed Negative   ? 36. Saccharomyces Cerevisiae  Negative   ? 37. Pork Negative   ? 38. [place]urkey Meat Negative   ? 39. Chicken Meat Negative   ? 40. Beef Negative   ? 41. Lamb Negative   ? 42. Tomato Negative   ? 43. White Potato Negative   ? 44. Sweet Potato Negative   ? 45. Pea, Green/English Negative   ? 46. Navy Bean Negative   ? 47. Mushrooms Negative   ? 48. Avocado Negative   ? 49. Onion Negative   ? 50. Cabbage Negative   ? 51. Carrots Negative   ? 52. Celery Negative   ? 53. Corn Negative   ? 54. Cucumber Negative   ? 55. Grape (White seedless) Negative   ? 56. Orange  Negative   ? 57. Banana  Negative   ? 58. Apple Negative   ? 59. Peach Negative   ? 60. Strawberry Negative   ? 61. Cantaloupe Negative   ? 62. Watermelon Negative   ? 63. Pineapple Negative   ? 64. Chocolate/Cacao bean Negative   ? 65. Karaya Gum Negative   ? 66. Acacia (Arabic Gum) Negative   ? 67. Cinnamon Negative   ? 68. Nutmeg Negative   ? 69. Ginger Negative   ? 70. Garlic Negative   ? 71. Pepper, black Negative   ? 72. Mustard Negative   ? ?  ?  ? ?  ? ? ?Food Intolerance Versus Food Allergy ? ?Food IntoleranceSome of the symptoms of food intolerance and food allergy are similar, but the differences between the two are very important. Eating a food you are intolerant to can leave you feeling miserable. However, if you have a true food allergy, your body?s reaction to this food could be life-threatening. ? ?Digestive system versus immune system ? ?A food intolerance response takes place in the digestive system. It occurs when you are unable to properly breakdown the food. This could be due to enzyme deficiencies, sensitivity to food additives or reactions to naturally occurring chemicals in foods. Often, people can eat small amounts of the food without causing problems. ? ?A food allergic reaction involves the immune system. Your immune system controls how your body defends itself. For instance, if you have an allergy to cow?s milk, your immune system identifies cow?s milk as an invader or allergen. Your immune system overreacts by producing antibodies called Immunoglobulin E (IgE). These antibodies travel to cells that release chemicals, causing an allergic reaction. Each type of IgE has a specific ?radar? for each type of allergen. ? ?Unlike an intolerance to food, a food allergy can cause a serious or even life-threatening reaction by eating a microscopic amount, touching or inhaling the food. ? ?Symptoms of allergic reactions to foods are generally seen on the skin (hives, itchiness, swelling of the skin). Gastrointestinal  symptoms may include vomiting and diarrhea. Respiratory symptoms may accompany skin and gastrointestinal symptoms, but don?t usually occur alone. ? ?Anaphylaxis (pronounced an-a-fi-LAK-sis) is a serious allergic reaction that happens very quickly. Symptoms of anaphylaxis may include difficulty breathing, dizziness or loss of consciousness. Without immediate treatment--an injection of epinephrine (adrenalin) and expert care--anaphylaxis can be fatal. ? ?To the Point: there is a very serious difference between being intolerant to a food and having a  food allergy. ? ? ? ? ?Regarding Food IgG Testing: In IgG testing, the blood is tested for IgG antibodies instead of being tested for IgE antibodies (i.e., the antibodies typically associated with food allergies). The existence of serum IgG antibodies towards particular foods is claimed by many practitioners as a tool to diagnose food allergy or intolerance. The problem with this is that IgG is a ?memory antibody.? IgG signifies exposure to a food, not allergy to a food. Since a normal immune system should make IgG antibodies to foreign proteins, a positive IgG test to a food is a sign of a normal immune system. In fact, a positive result can actually indicate tolerance for the food, not intolerance. There is no scientific evidence to support IgG testing for the diagnosis of food allergies. ? ? ? ? ? ? ?

## 2021-08-22 ENCOUNTER — Encounter: Payer: Self-pay | Admitting: Allergy & Immunology

## 2021-08-24 ENCOUNTER — Telehealth: Payer: Self-pay

## 2021-08-24 NOTE — Addendum Note (Signed)
Addended by: Hetty Blend on: 08/24/2021 11:23 AM ? ? Modules accepted: Orders ? ?

## 2021-08-24 NOTE — Telephone Encounter (Signed)
Called and spoke to patient and informed patient that more blood work was added. Patient stated that he has yet to get the blood work completed. I informed patient that I would sent the newest labs via mail so that he could have the papers to get his blood work completed. Patient verbalized understanding. I have sent the lab orders to the address on file.  ?

## 2021-08-25 NOTE — Telephone Encounter (Signed)
Thanks! I was also texting him as well. 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 This has never been explained at all. He  does follow with Dr. Crissie Figures at [facility].  Peripheral blood smear showed mildly decreased neutrophils without blast.  Lymphocytes were normal.  Red cells were normal.  Platelets were normal. ? 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  He does not have any stigmata of immune deficiency. ? ?Salvatore Marvel, MD ?[facility] [place] [facility] ? ?

## 2021-08-28 DIAGNOSIS — K9049 Malabsorption due to intolerance, not elsewhere classified: Secondary | ICD-10-CM | POA: Diagnosis not present

## 2021-08-29 LAB — CBC WITH DIFFERENTIAL/PLATELET
Basophils Absolute: 0 10*3/uL (ref 0.0–0.2)
Basos: 1 %
EOS (ABSOLUTE): 0 10*3/uL (ref 0.0–0.4)
Eos: 2 %
Hematocrit: 45 % (ref 37.5–51.0)
Hemoglobin: 15.4 g/dL (ref 13.0–17.7)
Immature Grans (Abs): 0 10*3/uL (ref 0.0–0.1)
Immature Granulocytes: 0 %
Lymphocytes Absolute: 0.8 10*3/uL (ref 0.7–3.1)
Lymphs: 41 %
MCH: 27.8 pg (ref 26.6–33.0)
MCHC: 34.2 g/dL (ref 31.5–35.7)
MCV: 81 fL (ref 79–97)
Monocytes Absolute: 0.3 10*3/uL (ref 0.1–0.9)
Monocytes: 14 %
Neutrophils Absolute: 0.9 10*3/uL — ABNORMAL LOW (ref 1.4–7.0)
Neutrophils: 42 %
Platelets: 145 10*3/uL — ABNORMAL LOW (ref 150–450)
RBC: 5.54 x10E6/uL (ref 4.14–5.80)
RDW: 12.3 % (ref 11.6–15.4)
WBC: 2.1 10*3/uL — CL (ref 3.4–10.8)

## 2021-09-16 DIAGNOSIS — R202 Paresthesia of skin: Secondary | ICD-10-CM | POA: Diagnosis not present

## 2021-09-16 DIAGNOSIS — K2960 Other gastritis without bleeding: Secondary | ICD-10-CM | POA: Diagnosis not present

## 2021-09-16 DIAGNOSIS — R200 Anesthesia of skin: Secondary | ICD-10-CM | POA: Diagnosis not present

## 2021-09-16 DIAGNOSIS — R079 Chest pain, unspecified: Secondary | ICD-10-CM | POA: Diagnosis not present

## 2021-11-23 ENCOUNTER — Ambulatory Visit
Admission: EM | Admit: 2021-11-23 | Discharge: 2021-11-23 | Disposition: A | Discharge status: Home / Self Care | Payer: PRIVATE HEALTH INSURANCE | Attending: Nurse Practitioner | Admitting: Nurse Practitioner

## 2021-11-23 DIAGNOSIS — S60450A Superficial foreign body of right index finger, initial encounter: Secondary | ICD-10-CM

## 2021-11-23 DIAGNOSIS — S60459A Superficial foreign body of unspecified finger, initial encounter: Secondary | ICD-10-CM

## 2021-11-23 MED ORDER — TETANUS-DIPHTH-ACELL PERTUSSIS 5-2.5-18.5 LF-MCG/0.5 IM SUSY
0.5000 mL | PREFILLED_SYRINGE | Freq: Once | INTRAMUSCULAR | Status: AC
  Administered 2021-11-23: 0.5 mL via INTRAMUSCULAR

## 2021-11-23 NOTE — Discharge Instructions (Signed)
Monitor the area for signs of infection to include drainage, swelling, redness that goes into the hand, or if you develop fever, chills, abdominal pain, nausea, vomiting, or diarrhea. As discussed, portions of the splinter may still be retained.  If so, your body should naturally get rid of it on its own.  The area may remain sore or tender until the foreign object is completely removed. May take ibuprofen or Tylenol as needed for pain or discomfort. Your tetanus shot was updated today.  It is good for the next 10 years. Follow-up as needed.

## 2021-11-23 NOTE — ED Provider Notes (Signed)
[facility]    CSN: [id number] Arrival date & time: 11/23/21  1919      History   Chief Complaint Chief Complaint  Patient presents with   Foreign Body in Skin    HPI Peter Durham is a 26 y.o. male.   The history is provided by the patient.   Patient presents for splinter in the right index finger and requesting a tetanus shot.  Patient was doing some work outside of the home and a piece of wood became stuck in the finger.  Patient states "I can see the point of injury.  He denies fever, chills, numbness, tingling, swelling, or bleeding.  Patient's last tetanus was more than 10 years ago per his report.  History reviewed. No pertinent past medical history.  Patient Active Problem List   Diagnosis Date Noted   History of polyuria 06/23/2021    History reviewed. No pertinent surgical history.     Home Medications    Prior to Admission medications   Medication Sig Start Date End Date Taking? Authorizing Provider  Ascorbic Acid (VITAMIN C) 100 MG CHEW Chew 0.5 tablets by mouth daily.    [provider]  Cholecalciferol (VITAMIN D3) 10 MCG (400 UNIT) CHEW Chew 0.5 tablets by mouth daily.    [provider]    Family History Family History  Problem Relation Age of Onset   Thyroid disease Mother    Hypertension Father    Hypertension Paternal Grandmother     Social History Social History   Tobacco Use   Smoking status: Never   Smokeless tobacco: Never  Vaping Use   Vaping Use: Never used  Substance Use Topics   Alcohol use: Yes    Comment: rare   Drug use: No     Allergies   Patient has no known allergies.   Review of Systems Review of Systems Per HPI  Physical Exam Triage Vital Signs ED Triage Vitals  Enc Vitals Group     BP 11/23/21 1932 116/75     Pulse Rate 11/23/21 1932 68     Resp 11/23/21 1932 20     Temp 11/23/21 1932 98.2 F (36.8 C)     Temp src --      SpO2 11/23/21 1932 98 %     Weight --       Height --      Head Circumference --      Peak Flow --      Pain Score 11/23/21 1930 2     Pain Loc --      Pain Edu? --      Excl. in GC? --    No data found.  Updated Vital Signs BP 116/75   Pulse 68   Temp 98.2 F (36.8 C)   Resp 20   SpO2 98%   Visual Acuity Right Eye Distance:   Left Eye Distance:   Bilateral Distance:    Right Eye Near:   Left Eye Near:    Bilateral Near:     Physical Exam Vitals and nursing note reviewed.  HENT:     Head: Normocephalic.  Eyes:     Extraocular Movements: Extraocular movements intact.     Conjunctiva/sclera: Conjunctivae normal.     Pupils: Pupils are equal, round, and reactive to light.  Pulmonary:     Effort: Pulmonary effort is normal.  Musculoskeletal:     Right hand: Normal capillary refill. Normal pulse.     Cervical back: Normal range  of motion.     Comments: Splinter in the DIP joint of the right index finger.  Skin:    General: Skin is warm and dry.  Neurological:     General: No focal deficit present.     Mental Status: He is oriented to person, place, and time.  Psychiatric:        Mood and Affect: Mood normal.        Behavior: Behavior normal.      UC Treatments / Results  Labs (all labs ordered are listed, but only abnormal results are displayed) Labs Reviewed - No data to display  EKG   Radiology No results found.  Procedures Foreign Body Removal  Date/Time: 11/23/2021 7:58 PM  Performed by: Abran Cantor, NP Authorized by: Abran Cantor, NP   Consent:    Consent obtained:  Verbal   Consent given by:  Patient   Risks, benefits, and alternatives were discussed: yes     Risks discussed:  Bleeding and incomplete removal Universal protocol:    Procedure explained and questions answered to patient or proxy's satisfaction: yes     Patient identity confirmed:  Verbally with patient and arm band Location:    Location: Right index finger.   Depth:  Intradermal   Tendon  involvement:  None Anesthesia:    Anesthesia method: Cold spray. Procedure details:    Localization method:  Visualized Post-procedure details:    Neurovascular status: intact     Skin closure:  None   Dressing:  Non-adherent dressing   Procedure completion:  Tolerated  (including critical care time)  Medications Ordered in UC Medications  Tdap (BOOSTRIX) injection 0.5 mL (0.5 mLs Intramuscular Given 11/23/21 1948)    Initial Impression / Assessment and Plan / UC Course  I have reviewed the triage vital signs and the nursing notes.  Pertinent labs & imaging results that were available during my care of the patient were reviewed by me and considered in my medical decision making (see chart for details).  Patient presents with a splinter to the right index finger.  Attempted foreign body removal with use of an 18-gauge needle and use of cold spray.  Patient was able to tolerate procedure well.  Did not see any remaining foreign body indicating the splinter was still present afterwards.  Informed patient that due to bleeding, retained foreign body could still be present.  Patient advised to continue to monitor the area for pain, swelling, or drainage.  Patient was provided supportive care instructions.  Tetanus shot was updated today.  Patient advised to follow-up if symptoms worsen. Final Clinical Impressions(s) / UC Diagnoses   Final diagnoses:  Splinter of finger     Discharge Instructions      Monitor the area for signs of infection to include drainage, swelling, redness that goes into the hand, or if you develop fever, chills, abdominal pain, nausea, vomiting, or diarrhea. As discussed, portions of the splinter may still be retained.  If so, your body should naturally get rid of it on its own.  The area may remain sore or tender until the foreign object is completely removed. May take ibuprofen or Tylenol as needed for pain or discomfort. Your tetanus shot was updated today.  It  is good for the next 10 years. Follow-up as needed.    ED Prescriptions   None    PDMP not reviewed this encounter.   Abran Cantor, NP 11/24/21 1212

## 2021-11-23 NOTE — ED Triage Notes (Signed)
Pt presents with splinter in right index finger, wants updated tetanus

## 2021-11-24 ENCOUNTER — Encounter: Payer: Self-pay | Admitting: Nurse Practitioner

## 2021-12-10 ENCOUNTER — Encounter: Payer: Self-pay | Admitting: Emergency Medicine

## 2021-12-10 ENCOUNTER — Other Ambulatory Visit: Payer: Self-pay

## 2021-12-10 ENCOUNTER — Ambulatory Visit
Admission: EM | Admit: 2021-12-10 | Discharge: 2021-12-10 | Disposition: A | Discharge status: Home / Self Care | Payer: Self-pay | Attending: Family Medicine | Admitting: Family Medicine

## 2021-12-10 DIAGNOSIS — R0602 Shortness of breath: Secondary | ICD-10-CM

## 2021-12-10 DIAGNOSIS — R002 Palpitations: Secondary | ICD-10-CM

## 2021-12-10 MED ORDER — PANTOPRAZOLE SODIUM 40 MG PO TBEC: 40.0000 mg | DELAYED_RELEASE_TABLET | Freq: Every day | ORAL | 0 refills | Status: DC

## 2021-12-10 NOTE — Discharge Instructions (Signed)
Go to the emergency department if your symptoms worsen at any time. °

## 2021-12-10 NOTE — ED Triage Notes (Addendum)
Pt reports palpitations after eating intermittently x1 year. Pt reports most recent flare-up within the last several hours.pt reports episodes are becoming more frequent with shortness of breath and left arm numbness. Nad noted. Airway patent. Denies chest pain.   Has seen pcp,endocrinologist, hematologist, and still attempting to see rheumatologist related to this issue without answers, known dx.  Pt also reports twin brother has WPW.

## 2021-12-14 NOTE — ED Provider Notes (Signed)
[facility]    CSN: [id number] Arrival date & time: 12/10/21  1303      History   Chief Complaint Chief Complaint  Patient presents with   Palpitations    HPI Peter Durham is a 26 y.o. male.   Presenting today with acute on chronic episodes of palpitations post mealtime associated with several minutes of SOB, arm numbness that is self resolving. States this has been ongoing for a year and has seen multiple specialists and PCP regarding this issue with so far no obvious causes identified. Denies any worsening or changing sxs, headache, dizziness, CP, abdominal pain, N/V/D. Not trying anything OTC for sxs other than drinking plenty of fluids prior to meals. Of note, states his twin brother has WPW.     History reviewed. No pertinent past medical history.  Patient Active Problem List   Diagnosis Date Noted   History of polyuria 06/23/2021    History reviewed. No pertinent surgical history.     Home Medications    Prior to Admission medications   Medication Sig Start Date End Date Taking? Authorizing Provider  pantoprazole (PROTONIX) 40 MG tablet Take 1 tablet (40 mg total) by mouth daily. 12/10/21  Yes Particia Nearing, PA-C  Ascorbic Acid (VITAMIN C) 100 MG CHEW Chew 0.5 tablets by mouth daily.    [provider]  Cholecalciferol (VITAMIN D3) 10 MCG (400 UNIT) CHEW Chew 0.5 tablets by mouth daily.    [provider]    Family History Family History  Problem Relation Age of Onset   Thyroid disease Mother    Hypertension Father    Hypertension Paternal Grandmother     Social History Social History   Tobacco Use   Smoking status: Never   Smokeless tobacco: Never  Vaping Use   Vaping Use: Never used  Substance Use Topics   Alcohol use: Yes    Comment: rare   Drug use: No     Allergies   Patient has no known allergies.   Review of Systems Review of Systems PER HPI  Physical Exam Triage Vital Signs ED Triage  Vitals  Enc Vitals Group     BP 12/10/21 1319 117/76     Pulse Rate 12/10/21 1319 76     Resp 12/10/21 1319 20     Temp 12/10/21 1319 98.2 F (36.8 C)     Temp Source 12/10/21 1319 Oral     SpO2 12/10/21 1319 96 %     Weight --      Height --      Head Circumference --      Peak Flow --      Pain Score 12/10/21 1320 0     Pain Loc --      Pain Edu? --      Excl. in GC? --    No data found.  Updated Vital Signs BP 117/76 (BP Location: Right Arm)   Pulse 76   Temp 98.2 F (36.8 C) (Oral)   Resp 20   SpO2 96%   Visual Acuity Right Eye Distance:   Left Eye Distance:   Bilateral Distance:    Right Eye Near:   Left Eye Near:    Bilateral Near:     Physical Exam Vitals and nursing note reviewed.  Constitutional:      Appearance: Normal appearance.  HENT:     Head: Atraumatic.     Mouth/Throat:     Mouth: Mucous membranes are moist.  Eyes:  Extraocular Movements: Extraocular movements intact.     Conjunctiva/sclera: Conjunctivae normal.  Cardiovascular:     Rate and Rhythm: Normal rate and regular rhythm.     Heart sounds: Normal heart sounds.  Pulmonary:     Effort: Pulmonary effort is normal.     Breath sounds: Normal breath sounds.  Abdominal:     General: Bowel sounds are normal. There is no distension.     Palpations: Abdomen is soft.     Tenderness: There is no abdominal tenderness. There is no guarding.  Musculoskeletal:        General: Normal range of motion.     Cervical back: Normal range of motion and neck supple.  Skin:    General: Skin is warm and dry.  Neurological:     General: No focal deficit present.     Mental Status: He is oriented to person, place, and time.  Psychiatric:        Mood and Affect: Mood normal.        Thought Content: Thought content normal.        Judgment: Judgment normal.      UC Treatments / Results  Labs (all labs ordered are listed, but only abnormal results are displayed) Labs Reviewed - No data to  display  EKG   Radiology No results found.  Procedures Procedures (including critical care time)  Medications Ordered in UC Medications - No data to display  Initial Impression / Assessment and Plan / UC Course  I have reviewed the triage vital signs and the nursing notes.  Pertinent labs & imaging results that were available during my care of the patient were reviewed by me and considered in my medical decision making (see chart for details).     Vital signs and exam benign, EKG unchanged from previous with no acute ST elevations or arrhythmias, and declines repeat labs and other testing as he's had recent further workup that has been benign. Cardiology info given, discussed f/u with GI specialist as well and will trial protonix to see if reflux and esophageal spasms may be contributing to his sxs. ED for worsening sxs at any time.   Final Clinical Impressions(s) / UC Diagnoses   Final diagnoses:  Palpitations  SOB (shortness of breath)     Discharge Instructions      Go to the emergency department if your symptoms worsen at any time    ED Prescriptions     Medication Sig Dispense Auth. Provider   pantoprazole (PROTONIX) 40 MG tablet Take 1 tablet (40 mg total) by mouth daily. 30 tablet Particia Nearing, [place]      PDMP not reviewed this encounter.   Particia Nearing, [place] 12/14/21 2158

## 2021-12-18 ENCOUNTER — Encounter: Payer: Self-pay | Admitting: "Endocrinology

## 2021-12-23 DIAGNOSIS — R768 Other specified abnormal immunological findings in serum: Secondary | ICD-10-CM | POA: Diagnosis not present

## 2021-12-25 DIAGNOSIS — K9049 Malabsorption due to intolerance, not elsewhere classified: Secondary | ICD-10-CM | POA: Diagnosis not present

## 2021-12-25 DIAGNOSIS — R1013 Epigastric pain: Secondary | ICD-10-CM | POA: Diagnosis not present

## 2022-01-04 ENCOUNTER — Ambulatory Visit (INDEPENDENT_AMBULATORY_CARE_PROVIDER_SITE_OTHER): Payer: Self-pay | Admitting: "Endocrinology

## 2022-01-04 ENCOUNTER — Encounter: Payer: Self-pay | Admitting: "Endocrinology

## 2022-01-04 VITALS — BP 98/64 | HR 68 | Ht 71.0 in | Wt 185.2 lb

## 2022-01-04 DIAGNOSIS — Z87898 Personal history of other specified conditions: Secondary | ICD-10-CM

## 2022-01-04 NOTE — Progress Notes (Signed)
01/04/2022, 6:26 PM  Endocrinology follow-up note   Subjective:    Patient ID: Peter Durham, male    DOB: January 07, 1996, PCP Pcp, No   History reviewed. No pertinent past medical history. History reviewed. No pertinent surgical history. Social History   Socioeconomic History   Marital status: Single    Spouse name: Not on file   Number of children: Not on file   Years of education: Not on file   Highest education level: Not on file  Occupational History   Not on file  Tobacco Use   Smoking status: Never   Smokeless tobacco: Never  Vaping Use   Vaping Use: Never used  Substance and Sexual Activity   Alcohol use: Yes    Comment: rare   Drug use: No   Sexual activity: Yes    Birth control/protection: Abstinence  Other Topics Concern   Not on file  Social History Narrative   Not on file   Social Determinants of Health   Financial Resource Strain: Not on file  Food Insecurity: Not on file  Transportation Needs: Not on file  Physical Activity: Not on file  Stress: Not on file  Social Connections: Not on file   Family History  Problem Relation Age of Onset   Thyroid disease Mother    Hypertension Father    Hypertension Paternal Grandmother    Outpatient Encounter Medications as of 01/04/2022  Medication Sig   MAGNESIUM CITRATE PO Take 1 tablet by mouth. Pt taking 2-3 times a week   Multiple Vitamin (MULTIVITAMIN ADULT PO) Take 1 tablet by mouth daily.   [DISCONTINUED] Ascorbic Acid (VITAMIN C) 100 MG CHEW Chew 0.5 tablets by mouth daily.   [DISCONTINUED] Cholecalciferol (VITAMIN D3) 10 MCG (400 UNIT) CHEW Chew 0.5 tablets by mouth daily.   [DISCONTINUED] pantoprazole (PROTONIX) 40 MG tablet Take 1 tablet (40 mg total) by mouth daily.   No facility-administered encounter medications on file as of 01/04/2022.   ALLERGIES: No Known Allergies  VACCINATION STATUS: Immunization History  Administered Date(s)  Administered   DTaP 01/20/1996, 03/16/1996, 07/03/1996, 02/22/1997, 01/17/2002   Hepatitis B 12/01/1995, 07/03/1996   HiB (PRP-OMP) 01/20/1996, 03/16/1996, 07/03/1996, 02/22/1997   IPV 01/20/1996, 03/16/1996, 02/22/1997   MMR 02/22/1997   Meningococcal Conjugate 02/28/2008, 12/05/2013   Meningococcal Polysaccharide 02/28/2008   Moderna Sars-Covid-2 Vaccination 10/03/2019, 11/03/2019   Tdap 01/11/2007, 11/23/2021   Varicella 10/19/1996    HPI Peter Durham is 26 y.o. male who presents today with a medical history as above. he is being seen in follow-up after he was seen in consultation for polyuria . See notes from his previous visit.   He has no new complaints today.  His previous work-up did not confirm a diagnosis of diabetes insipidus.  He did not have previsit CMP.  He reports that his current fluid intake is between 80-100 ounces daily.  His urination frequency is that of  average.  He never had documented hypernatremia.  He denies any history of head injury or exposure to lithium. He continued to have leukopenia, thrombocytopenia, positive ANA titers.  He has seen a rheumatologist in [place], work-up in progress.   His previous CKD is resolved.    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 He does not have diabetes mellitus.  He is not on medications on regular basis.  He is a non-smoker.  No use of illicit drugs.  He denies any other chronic medical problems such as liver disease nor kidney disease.    Review of Systems  Constitutional: + Recent weight gain,  no fatigue, no subjective hyperthermia, no subjective hypothermia   Objective:       01/04/2022    9:23 AM 12/10/2021    1:19 PM 11/23/2021    7:32 PM  Vitals with BMI  Height _0     Weight 185 lbs 3 oz    BMI 82.70    Systolic 98 786 754  Diastolic 64 76 75  Pulse 68 76 68    BP 98/64   Pulse 68   Ht _1  (1.803 m)   Wt 185 lb 3.2 oz (84 kg)   BMI 25.83 kg/m   Wt  Readings from Last 3 Encounters:  01/04/22 185 lb 3.2 oz (84 kg)  08/17/21 206 lb 6.4 oz (93.6 kg)  07/07/21 211 lb (95.7 kg)    Physical Exam  Constitutional:  Body mass index is 25.83 kg/m.,  not in acute distress, normal state of mind Eyes: PERRLA, EOMI, no exophthalmos ENT: moist mucous membranes, no gross thyromegaly, no gross cervical lymphadenopathy   CMP ( most recent)  No results found for this or any previous visit (from the past 2160 hour(s)).      Lab Results  Component Value Date   TSH 3.487 11/13/2020           Assessment & Plan:   1. History of polyuria  I reviewed his existing and new results with him.  He has no new complaints since last visit.  His current water consumption is from 80-100 ounces daily.  He did not document specific urine output, does not complain of frequent urination.  He does not wake up at night.  Based on previous labs and his current presentation, diabetes insipidus is still unlikely.  He does not have recent serum electrolytes.  He will be sent to lab for CMP, serum magnesium, phosphorus, serum osmolality, urine osmolality.   He will be contacted if his labs are abnormal. -He continues to have leukopenia, thrombocytopenia, associated with positive ANA titer.  He was evaluated by rheumatologist in [place], work-up in progress. -The 25 pounds of weight loss he has experienced is explained by his dietary change and reversal of his weight gain prior to his last visit. - Suggestion is made for him to avoid simple carbohydrates  from his diet including Cakes, Sweet Desserts, Ice Cream, Soda (diet and regular), Sweet Tea, Candies, Chips, Cookies, Store Bought Juices, Alcohol , Artificial Sweeteners,  Coffee Creamer, and "Sugar-free" Products, Lemonade. This will help patient to have more stable body weight and avoid unintended weight gain.     Whole Foods, Plant-Based Nutrition comprising of fruits and vegetables, plant-based proteins,  whole-grain carbohydrates was discussed in detail with the patient.   A list for source of those nutrients were also provided to the patient.  Patient will use only water or unsweetened tea for hydration.  Optimal exercise was discussed with the patient .  His renal function is back to normal, he is advised to avoid over-the-counter nonsteroidal anti-inflammatory medications such as ibuprofen, Aleve.  He is advised to maintain adequate hydration.  He is advised to continue follow-up with his rheumatologist. - I did not initiate any new  prescriptions today. - he is advised to maintain close follow up with his PCP for primary care needs.  I spent 21 minutes in the care of the patient today including review of labs from Thyroid Function, CMP, and other relevant labs ; imaging/biopsy records (current and previous including abstractions from other facilities); face-to-face time discussing  his lab results and symptoms, medications doses, his options of short and long term treatment based on the latest standards of care / guidelines;   and documenting the encounter.  [name]  participated in the discussions, expressed understanding, and voiced agreement with the above plans.  All questions were answered to his satisfaction. he is encouraged to contact clinic should he have any questions or concerns prior to his return visit.   Follow up plan: Return in about 6 months (around 07/07/2022) for Labs Today- Non-Fasting Ok.   Glade Lloyd, MD [facility] [address] [place], [place] [address] Phone: [phone]  Fax: [phone]     01/04/2022, 6:26 PM  This note was partially dictated with voice recognition software. Similar sounding words can be transcribed inadequately or may not  be corrected upon review.

## 2022-01-12 DIAGNOSIS — R768 Other specified abnormal immunological findings in serum: Secondary | ICD-10-CM | POA: Diagnosis not present

## 2022-01-12 DIAGNOSIS — R899 Unspecified abnormal finding in specimens from other organs, systems and tissues: Secondary | ICD-10-CM | POA: Diagnosis not present

## 2022-01-13 DIAGNOSIS — D72819 Decreased white blood cell count, unspecified: Secondary | ICD-10-CM | POA: Diagnosis not present

## 2022-01-13 DIAGNOSIS — R634 Abnormal weight loss: Secondary | ICD-10-CM | POA: Diagnosis not present

## 2022-01-13 DIAGNOSIS — M359 Systemic involvement of connective tissue, unspecified: Secondary | ICD-10-CM | POA: Diagnosis not present

## 2022-01-13 DIAGNOSIS — R768 Other specified abnormal immunological findings in serum: Secondary | ICD-10-CM | POA: Diagnosis not present

## 2022-01-27 ENCOUNTER — Encounter: Payer: Self-pay | Admitting: Cardiology

## 2022-01-27 DIAGNOSIS — R109 Unspecified abdominal pain: Secondary | ICD-10-CM | POA: Diagnosis not present

## 2022-01-27 NOTE — Progress Notes (Unsigned)
Cardiology Office Note  Date: 01/28/2022   ID: Peter Durham, DOB 1995-11-28, MRN [id number]  PCP:  Rondel Oh, NP  Cardiologist:  Nona Dell, MD Electrophysiologist:  None   Chief Complaint  Patient presents with   Palpitations    History of Present Illness: Peter Durham is a 26 y.o. male referred for cardiology consultation by Ms. Judeth Porch after visit at [facility] urgent care in July with palpitations.  No specific arrhythmias were documented at that time.  He tells me that over the last year he has been experiencing postprandial palpitations.  Also has bloating and indigestion at times and is in the process of a formal GI evaluation.  He has identified some foods that precipitate symptoms.  Feels somewhat less stamina over the last year, lack of energy.  NYHA class II dyspnea with most activities.  Not exercising regularly at this time.  He has had no exertional chest pain or unexplained syncope.  The episodes of palpitations are described as a sense of rapid heartbeat, this typically resolves spontaneously.  He has not had any documented arrhythmias so far.  Does report WPW in a twin brother.  Patient's baseline ECG does not show preexcitation.  Also has history of positive ANA, however by his report no subsequent abnormalities based on further rheumatology evaluation.  He also sees an endocrinologist for evaluation of polyuria, but does not have diagnosis of diabetes mellitus.  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 No pertinent surgical history.  Current Outpatient Medications  Medication Sig Dispense Refill   MAGNESIUM CITRATE PO Take 1 tablet by mouth. Pt taking 2-3 times a week     Multiple Vitamin (MULTIVITAMIN ADULT PO) Take 1 tablet by mouth daily.     No current facility-administered medications for this  visit.   Allergies:  Patient has no known allergies.   Social History: The patient  reports that he has never smoked. He has never used smokeless tobacco. He reports current alcohol use. He reports that he does not use drugs.   Family History: The patient's family history includes Hypertension in his father and paternal grandmother; Thyroid disease in his mother.   ROS: No orthopnea or PND.  No leg swelling.  Physical Exam: VS:  BP 114/78   Pulse 67   Ht 5\' 11"  (1.803 m)   Wt 174 lb 12.8 oz (79.3 kg)   SpO2 96%   BMI 24.38 kg/m , BMI Body mass index is 24.38 kg/m.  Wt Readings from Last 3 Encounters:  01/28/22 174 lb 12.8 oz (79.3 kg)  01/04/22 185 lb 3.2 oz (84 kg)  08/17/21 206 lb 6.4 oz (93.6 kg)    General: Patient appears comfortable at rest. HEENT: Conjunctiva and lids normal. Neck: Supple, no elevated JVP or carotid bruits, no thyromegaly. Lungs: Clear to auscultation, nonlabored breathing at rest. Cardiac: Regular rate and rhythm, no S3 or significant systolic murmur, no pericardial rub. Abdomen: Soft, bowel sounds present, no guarding or rebound. Extremities: No pitting edema, distal pulses 2+. Skin: Warm and dry. Musculoskeletal: No kyphosis. Neuropsychiatric: Alert and oriented x3, affect grossly appropriate.  ECG:  An ECG dated 12/10/2021 was personally reviewed today and demonstrated:  Sinus rhythm with nondiagnostic inferior Q waves, no obvious preexcitation or Brugada pattern.  Recent Labwork: 06/30/2021: ALT 24; AST 18;  BUN 9; Creatinine, Ser 1.30; Potassium 4.5; Sodium 142 08/28/2021: Hemoglobin 15.4; Platelets 145   Other Studies Reviewed Today:  Chest x-ray 09/16/2021 ([facility]): Findings/Impression:   Normal heart size. Normal mediastinal and hilar contours. Lungs are clear.  No pleural effusions or pneumothorax. Osseous structures are unremarkable.   Assessment and Plan:  Palpitations as outlined above.  These are largely postprandial, no associated  syncope or chest pain.  Baseline ECG does not show preexcitation or Brugada pattern.  He reports a twin brother with WPW.  I asked him to clarify medical history with his other family members if possible.  Plan at this time is to obtain a 7-day ZIO XT for further investigation of symptoms as it relates to cardiac rhythm.  We will also get a GXT to exclude development of WPW pattern with exercise and any exercise-induced arrhythmias.  Obtain echocardiogram to clarify cardiac structure and function.  Continue with GI work-up in place.  Further recommendations to follow.  Medication Adjustments/Labs and Tests Ordered: Current medicines are reviewed at length with the patient today.  Concerns regarding medicines are outlined above.   Tests Ordered: Orders Placed This Encounter  Procedures   EXERCISE TOLERANCE TEST (ETT)   ECHOCARDIOGRAM COMPLETE    Medication Changes: No orders of the defined types were placed in this encounter.   Disposition:  Follow up  test results.  Signed, Jonelle Sidle, MD, [facility] 01/28/2022 9:43 AM    [facility] at [facility] [address] [place], [place], [place] [address] Phone: [phone]; Fax: [phone]

## 2022-01-28 ENCOUNTER — Telehealth: Payer: Self-pay | Admitting: Cardiology

## 2022-01-28 ENCOUNTER — Other Ambulatory Visit: Payer: BC Managed Care – PPO

## 2022-01-28 ENCOUNTER — Other Ambulatory Visit: Payer: Self-pay | Admitting: Cardiology

## 2022-01-28 ENCOUNTER — Ambulatory Visit: Payer: BC Managed Care – PPO | Admitting: Cardiology

## 2022-01-28 ENCOUNTER — Encounter: Payer: Self-pay | Admitting: Cardiology

## 2022-01-28 ENCOUNTER — Ambulatory Visit: Payer: BC Managed Care – PPO

## 2022-01-28 ENCOUNTER — Encounter: Payer: Self-pay | Admitting: *Deleted

## 2022-01-28 VITALS — BP 114/78 | HR 67 | Ht 71.0 in | Wt 174.8 lb

## 2022-01-28 DIAGNOSIS — R002 Palpitations: Secondary | ICD-10-CM

## 2022-01-28 DIAGNOSIS — Z8249 Family history of ischemic heart disease and other diseases of the circulatory system: Secondary | ICD-10-CM | POA: Diagnosis not present

## 2022-01-28 DIAGNOSIS — R0602 Shortness of breath: Secondary | ICD-10-CM | POA: Diagnosis not present

## 2022-01-28 NOTE — Telephone Encounter (Signed)
Checking percert on the following patient for testing scheduled at [facility].    GXT -02-04-2022  &  LONG TERM MONITOR

## 2022-01-28 NOTE — Patient Instructions (Addendum)
Medication Instructions:  Your physician recommends that you continue on your current medications as directed. Please refer to the Current Medication list given to you today.  Labwork: none  Testing/Procedures: Your physician has requested that you have an echocardiogram. Echocardiography is a painless test that uses sound waves to create images of your heart. It provides your doctor with information about the size and shape of your heart and how well your heart's chambers and valves are working. This procedure takes approximately one hour. There are no restrictions for this procedure. Your physician has requested that you have an exercise tolerance test. For further information please visit [email or web address] Please also follow instruction sheet, as given. ZIO- Long Term Monitor Instructions   Your physician has requested you wear your ZIO patch monitor 7 days.   This is a single patch monitor.  [employer] supplies one patch monitor per enrollment.  Additional stickers are not available.   Please do not apply patch if you will be having a Nuclear Stress Test, Echocardiogram, Cardiac CT, MRI, or Chest Xray during the time frame you would be wearing the monitor. The patch cannot be worn during these tests.  You cannot remove and re-apply the ZIO XT patch monitor.    Monitor given at office visit Once you have received you monitor, please review enclosed instructions.  Your monitor has already been registered assigning a specific monitor serial # to you.   Applying the monitor   Shave hair from upper left chest.   Hold abrader disc by orange tab.  Rub abrader in 40 strokes over left upper chest as indicated in your monitor instructions.   Clean area with 4 enclosed alcohol pads .  Use all pads to assure are is cleaned thoroughly.  Let dry.   Apply patch as indicated in monitor instructions.  Patch will be place under collarbone on left side of chest with arrow pointing upward.   Rub patch  adhesive wings for 2 minutes.Remove white label marked "1".  Remove white label marked "2".  Rub patch adhesive wings for 2 additional minutes.   While looking in a mirror, press and release button in center of patch.  A small green light will flash 3-4 times .  This will be your only indicator the monitor has been turned on.     Do not shower for the first 24 hours.  You may shower after the first 24 hours.   Press button if you feel a symptom. You will hear a small click.  Record Date, Time and Symptom in the Patient Log Book.   When you are ready to remove patch, follow instructions on last 2 pages of Patient Log Book.  Stick patch monitor onto last page of Patient Log Book.   Place Patient Log Book in [place] box.  Use locking tab on box and tape box closed securely.  The [employer] and [employer] has [employer] on it.  Please place in mailbox as soon as possible.  Your physician should have your test results approximately 7 days after the monitor has been mailed back to [facility].   Call [facility] Customer Care at [phone] if you have questions regarding your ZIO XT patch monitor.  Call them immediately if you see an orange light blinking on your monitor.   If your monitor falls off in less than 4 days contact our Monitor department at [phone].  If your monitor becomes loose or falls off after 4 days call [employer] at [phone] for suggestions  on securing your monitor.  Follow-Up: Your physician recommends that you schedule a follow-up appointment in: pending  Any Other Special Instructions Will Be Listed Below (If Applicable).  If you need a refill on your cardiac medications before your next appointment, please call your pharmacy.

## 2022-02-04 ENCOUNTER — Ambulatory Visit (HOSPITAL_COMMUNITY): Payer: BC Managed Care – PPO | Attending: Cardiology

## 2022-02-04 ENCOUNTER — Telehealth: Payer: Self-pay | Admitting: "Endocrinology

## 2022-02-04 ENCOUNTER — Ambulatory Visit (INDEPENDENT_AMBULATORY_CARE_PROVIDER_SITE_OTHER): Payer: BC Managed Care – PPO

## 2022-02-04 DIAGNOSIS — R0602 Shortness of breath: Secondary | ICD-10-CM | POA: Insufficient documentation

## 2022-02-04 NOTE — Telephone Encounter (Signed)
Received medical record request from [facility]. Scanned and sent release to [employer]

## 2022-02-05 LAB — ECHOCARDIOGRAM COMPLETE
AR max vel: 2.8 cm2
AV Area VTI: 2.63 cm2
AV Area mean vel: 2.72 cm2
AV Mean grad: 3.1 mmHg
AV Peak grad: 5.6 mmHg
Ao pk vel: 1.18 m/s
Area-P 1/2: 5.32 cm2
Calc EF: 65.2 %
MV M vel: 2.6 m/s
MV Peak grad: 27 mmHg
S' Lateral: 3.61 cm
Single Plane A2C EF: 63.9 %
Single Plane A4C EF: 65.4 %

## 2022-02-11 DIAGNOSIS — Z87898 Personal history of other specified conditions: Secondary | ICD-10-CM | POA: Diagnosis not present

## 2022-02-15 LAB — TSH: TSH: 1.54 u[IU]/mL (ref 0.450–4.500)

## 2022-02-15 LAB — COMPREHENSIVE METABOLIC PANEL
ALT: 14 IU/L (ref 0–44)
AST: 16 IU/L (ref 0–40)
Albumin/Globulin Ratio: 2.1 (ref 1.2–2.2)
Albumin: 4.8 g/dL (ref 4.3–5.2)
Alkaline Phosphatase: 87 IU/L (ref 44–121)
BUN/Creatinine Ratio: 10 (ref 9–20)
BUN: 12 mg/dL (ref 6–20)
Bilirubin Total: 1.9 mg/dL — ABNORMAL HIGH (ref 0.0–1.2)
CO2: 23 mmol/L (ref 20–29)
Calcium: 9.8 mg/dL (ref 8.7–10.2)
Chloride: 102 mmol/L (ref 96–106)
Creatinine, Ser: 1.2 mg/dL (ref 0.76–1.27)
Globulin, Total: 2.3 g/dL (ref 1.5–4.5)
Glucose: 80 mg/dL (ref 70–99)
Potassium: 4.2 mmol/L (ref 3.5–5.2)
Sodium: 140 mmol/L (ref 134–144)
Total Protein: 7.1 g/dL (ref 6.0–8.5)
eGFR: 86 mL/min/{1.73_m2} (ref >59)

## 2022-02-15 LAB — PHOSPHORUS: Phosphorus: 3.4 mg/dL (ref 2.8–4.1)

## 2022-02-15 LAB — OSMOLALITY, URINE: Osmolality, Ur: 116 mOsmol/kg

## 2022-02-15 LAB — OSTEOCALCIN, SERUM: Osteocalcin: 18.5 ng/mL (ref 3.2–39.6)

## 2022-02-15 LAB — T4, FREE: Free T4: 1.23 ng/dL (ref 0.82–1.77)

## 2022-02-15 LAB — MAGNESIUM: Magnesium: 2 mg/dL (ref 1.6–2.3)

## 2022-02-23 NOTE — Telephone Encounter (Signed)
Contacted to see if he planned to do GXT and wear ZIO monitor. Says he would like to cancel test for now and follow through with GI doctor. Says he will bring ZIO monitor back to office

## 2022-02-23 NOTE — Telephone Encounter (Signed)
-----   Message from Merlene Laughter, RN sent at 01/28/2022 10:04 AM EDT ----- Regarding: 7 DAY ZIO XT/ENROLLED 01/28/22/PLACING SELF AT HOME AFTER TEST/[place]

## 2022-07-07 ENCOUNTER — Ambulatory Visit: Payer: Self-pay | Admitting: "Endocrinology

## 2022-08-06 ENCOUNTER — Ambulatory Visit
Admission: EM | Admit: 2022-08-06 | Discharge: 2022-08-06 | Disposition: A | Discharge status: Home / Self Care | Payer: BC Managed Care – PPO | Attending: Nurse Practitioner | Admitting: Nurse Practitioner

## 2022-08-06 ENCOUNTER — Encounter: Payer: Self-pay | Admitting: Emergency Medicine

## 2022-08-06 ENCOUNTER — Other Ambulatory Visit: Payer: Self-pay

## 2022-08-06 ENCOUNTER — Ambulatory Visit (INDEPENDENT_AMBULATORY_CARE_PROVIDER_SITE_OTHER): Payer: BC Managed Care – PPO

## 2022-08-06 ENCOUNTER — Emergency Department (HOSPITAL_COMMUNITY): Payer: BC Managed Care – PPO

## 2022-08-06 ENCOUNTER — Emergency Department (HOSPITAL_COMMUNITY)
Admission: EM | Admit: 2022-08-06 | Discharge: 2022-08-06 | Disposition: A | Discharge status: Home / Self Care | Payer: BC Managed Care – PPO | Attending: Emergency Medicine | Admitting: Emergency Medicine

## 2022-08-06 DIAGNOSIS — R002 Palpitations: Secondary | ICD-10-CM | POA: Diagnosis not present

## 2022-08-06 DIAGNOSIS — R9431 Abnormal electrocardiogram [ECG] [EKG]: Secondary | ICD-10-CM

## 2022-08-06 DIAGNOSIS — I4891 Unspecified atrial fibrillation: Secondary | ICD-10-CM

## 2022-08-06 DIAGNOSIS — K5900 Constipation, unspecified: Secondary | ICD-10-CM | POA: Diagnosis not present

## 2022-08-06 LAB — RAPID URINE DRUG SCREEN, HOSP PERFORMED
Amphetamines: NOT DETECTED
Barbiturates: NOT DETECTED
Benzodiazepines: NOT DETECTED
Cocaine: NOT DETECTED
Opiates: NOT DETECTED
Tetrahydrocannabinol: NOT DETECTED

## 2022-08-06 LAB — CBC
HCT: 47.9 % (ref 39.0–52.0)
Hemoglobin: 16.2 g/dL (ref 13.0–17.0)
MCH: 28 pg (ref 26.0–34.0)
MCHC: 33.8 g/dL (ref 30.0–36.0)
MCV: 82.9 fL (ref 80.0–100.0)
Platelets: 165 10*3/uL (ref 150–400)
RBC: 5.78 MIL/uL (ref 4.22–5.81)
RDW: 12.2 % (ref 11.5–15.5)
WBC: 3.6 10*3/uL — ABNORMAL LOW (ref 4.0–10.5)
nRBC: 0 % (ref 0.0–0.2)

## 2022-08-06 LAB — BASIC METABOLIC PANEL
Anion gap: 9 (ref 5–15)
BUN: 13 mg/dL (ref 6–20)
CO2: 26 mmol/L (ref 22–32)
Calcium: 9.3 mg/dL (ref 8.9–10.3)
Chloride: 102 mmol/L (ref 98–111)
Creatinine, Ser: 1.11 mg/dL (ref 0.61–1.24)
GFR, Estimated: >60 mL/min (ref >60)
Glucose, Bld: 97 mg/dL (ref 70–99)
Potassium: 3.9 mmol/L (ref 3.5–5.1)
Sodium: 137 mmol/L (ref 135–145)

## 2022-08-06 LAB — TSH: TSH: 2.934 u[IU]/mL (ref 0.350–4.500)

## 2022-08-06 LAB — URINALYSIS, ROUTINE W REFLEX MICROSCOPIC
Bilirubin Urine: NEGATIVE
Glucose, UA: NEGATIVE mg/dL
Hgb urine dipstick: NEGATIVE
Ketones, ur: NEGATIVE mg/dL
Leukocytes,Ua: NEGATIVE
Nitrite: NEGATIVE
Protein, ur: NEGATIVE mg/dL
Specific Gravity, Urine: 1.002 — ABNORMAL LOW (ref 1.005–1.030)
pH: 7 (ref 5.0–8.0)

## 2022-08-06 LAB — TROPONIN I (HIGH SENSITIVITY): Troponin I (High Sensitivity): 5 ng/L (ref <18)

## 2022-08-06 LAB — MAGNESIUM: Magnesium: 2 mg/dL (ref 1.7–2.4)

## 2022-08-06 MED ORDER — METOPROLOL SUCCINATE ER 25 MG PO TB24: ORAL_TABLET | ORAL | 0 refills | Status: DC

## 2022-08-06 NOTE — ED Provider Notes (Signed)
[place] Provider Note   CSN: [id number] Arrival date & time: 08/06/22  1910     History  No chief complaint on file.   Peter Durham is a 27 y.o. male.  Pt is a 27 yo male with pmhx significant for dyspepsia and palpitations.  Pt has been to Dr. Domenic Polite in the past for palpitations.  He has always had normal EKGs when he sees him.  His twin brother has WPW.  Pt felt like his heart started racing about 3 hours ago.  He initially went to [facility] and was sent here for further eval. Pt denies any regular meds.  No unprescribed drugs.  No supplements other than vitamin C.       Home Medications Prior to Admission medications   Medication Sig Start Date End Date Taking? Authorizing Provider  cholecalciferol 25 MCG (1000 UT) tablet Take 1,000 Units by mouth daily. Take 1/2 gummy daily   Yes [provider]  metoprolol succinate (TOPROL-XL) 25 MG 24 hr tablet Take once a day if you develop palpitations again. 08/06/22  Yes Isla Pence, MD  Multiple Vitamin (MULTIVITAMIN ADULT PO) Take 1 tablet by mouth daily.   Yes [provider]  Vibegron (GEMTESA) 75 MG TABS Take 75 mg by mouth as needed (bladder). Take 1/2 tablet as needed for bladder   Yes [provider]  vitamin C (ASCORBIC ACID) 250 MG tablet Take 250 mg by mouth daily.   Yes [provider]  Multiple Vitamin (MULTI VITAMIN) TABS 1 tablet Orally Once a day for 30 day(s) Patient not taking: Reported on 08/06/2022    [provider]      Allergies    Patient has no known allergies.    Review of Systems   Review of Systems  Cardiovascular:  Positive for palpitations.  All other systems reviewed and are negative.   Physical Exam Updated Vital Signs BP (!) 131/91   Pulse 87   Temp 98.3 F (36.8 C)   Resp 11   SpO2 100%  Physical Exam Vitals and nursing note reviewed.  Constitutional:      Appearance: Normal appearance.  HENT:      Head: Normocephalic and atraumatic.     Right Ear: External ear normal.     Left Ear: External ear normal.     Nose: Nose normal.     Mouth/Throat:     Mouth: Mucous membranes are dry.  Eyes:     Extraocular Movements: Extraocular movements intact.     Conjunctiva/sclera: Conjunctivae normal.     Pupils: Pupils are equal, round, and reactive to light.  Cardiovascular:     Rate and Rhythm: Tachycardia present. Rhythm irregular.     Pulses: Normal pulses.     Heart sounds: Normal heart sounds.  Pulmonary:     Effort: Pulmonary effort is normal.     Breath sounds: Normal breath sounds.  Abdominal:     General: Abdomen is flat. Bowel sounds are normal.     Palpations: Abdomen is soft.  Musculoskeletal:        General: Normal range of motion.     Cervical back: Normal range of motion and neck supple.  Skin:    General: Skin is warm.     Capillary Refill: Capillary refill takes less than 2 seconds.  Neurological:     General: No focal deficit present.     Mental Status: He is alert and oriented to person, place, and  time.  Psychiatric:        Mood and Affect: Mood normal.        Behavior: Behavior normal.     ED Results / Procedures / Treatments   Labs (all labs ordered are listed, but only abnormal results are displayed) Labs Reviewed  CBC - Abnormal; Notable for the following components:      Result Value   WBC 3.6 (*)    All other components within normal limits  URINALYSIS, ROUTINE W REFLEX MICROSCOPIC - Abnormal; Notable for the following components:   Color, Urine COLORLESS (*)    Specific Gravity, Urine 1.002 (*)    All other components within normal limits  BASIC METABOLIC PANEL  MAGNESIUM  TSH  RAPID URINE DRUG SCREEN, HOSP PERFORMED  TROPONIN I (HIGH SENSITIVITY)  TROPONIN I (HIGH SENSITIVITY)    EKG EKG Interpretation  Date/Time:  Friday August 06 2022 20:10:49 EST Ventricular Rate:  77 PR Interval:  142 QRS Duration: 102 QT Interval:  339 QTC  Calculation: 384 R Axis:   92 Text Interpretation: Sinus arrhythmia Borderline right axis deviation Borderline repolarization abnormality now in NSR Confirmed by Isla Pence [phone]) on 08/06/2022 8:14:38 PM  Radiology DG Chest Port 1 View  Result Date: 08/06/2022 CLINICAL DATA:  Atrial fibrillation EXAM: PORTABLE CHEST 1 VIEW COMPARISON:  Film from earlier in the same day. FINDINGS: The heart size and mediastinal contours are within normal limits. Both lungs are clear. The visualized skeletal structures are unremarkable. IMPRESSION: No active disease. Electronically Signed   By: Inez Catalina M.D.   On: 08/06/2022 20:11   DG Chest 2 View  Result Date: 08/06/2022 CLINICAL DATA:  Constipation. EXAM: CHEST - 2 VIEW COMPARISON:  Chest radiograph dated 09/12/2004. FINDINGS: The heart size and mediastinal contours are within normal limits. Both lungs are clear. The visualized skeletal structures are unremarkable. IMPRESSION: No active cardiopulmonary disease. Electronically Signed   By: Anner Crete M.D.   On: 08/06/2022 18:16    Procedures Procedures    Medications Ordered in ED Medications - No data to display  ED Course/ Medical Decision Making/ A&P                             Medical Decision Making Amount and/or Complexity of Data Reviewed Labs: ordered. Radiology: ordered.  Risk Prescription drug management.   This patient presents to the ED for concern of palpitations, this involves an extensive number of treatment options, and is a complaint that carries with it a high risk of complications and morbidity.  The differential diagnosis includes afib, svt, wpw, st   Co morbidities that complicate the patient evaluation  none   Additional history obtained:  Additional history obtained from epic chart review External records from outside source obtained and reviewed including mom   Lab Tests:  I Ordered, and personally interpreted labs.  The pertinent results include:   ua nl, uds nl, tsh nl, mg nl, cbc nl, bmp nl   Imaging Studies ordered:  I ordered imaging studies including cxr  I independently visualized and interpreted imaging which showed No active disease.  I agree with the radiologist interpretation   Cardiac Monitoring:  The patient was maintained on a cardiac monitor.  I personally viewed and interpreted the cardiac monitored which showed an underlying rhythm of: initially afib with rvr; now NSR (both NARROW complex QRS)   Medicines ordered and prescription drug management:   I have reviewed the  patients home medicines and have made adjustments as needed  Consultations Obtained:  I requested consultation with the cardiologist (Dr. Shirlee Latch),  and discussed lab and imaging findings as well as pertinent plan - he recommends giving pt a prn rx for metoprolol if he develops palpitations again.  F/u as an outpatient   Problem List / ED Course:  Afib with rvr:  spontaneous conversion.  CHA2DS2/VAS Stroke Risk Points of 0, so I did not start him on any blood thinners.  I gave him a prn metoprolol rx.  Pt is referred to the afib clinic and is to f/u with dr. Domenic Polite.  Return if worse.       Reevaluation:  After the interventions noted above, I reevaluated the patient and found that they have :improved   Social Determinants of Health:  Lives at home   Dispostion:  After consideration of the diagnostic results and the patients response to treatment, I feel that the patent would benefit from discharge with outpatient f/u.          Final Clinical Impression(s) / ED Diagnoses Final diagnoses:  Atrial fibrillation with RVR [facility])    Rx / DC Orders ED Discharge Orders          Ordered    Amb referral to AFIB Clinic        08/06/22 1951    metoprolol succinate (TOPROL-XL) 25 MG 24 hr tablet        08/06/22 2212              Isla Pence, MD 08/06/22 2216

## 2022-08-06 NOTE — ED Notes (Signed)
Patient is being discharged from the Urgent Care and sent to the Emergency Department via POV . Per NP, patient is in need of higher level of care due to abnormal EKG. Patient is aware and verbalizes understanding of plan of care.  Vitals:   08/06/22 1752  BP: 134/77  Pulse: 82  Resp: 17  Temp: 98.1 F (36.7 C)  SpO2: 100%

## 2022-08-06 NOTE — ED Provider Notes (Signed)
[facility]    CSN: [id number] Arrival date & time: 08/06/22  1742      History   Chief Complaint Chief Complaint  Patient presents with   Palpitations    HPI Peter Durham is a 27 y.o. male.   The history is provided by the patient.   The patient presents for complaints of "palpitations" that been present over the past 24 hours.  Patient states that his palpitations come and go.  He states that he is currently experiencing them at this time, he states he feels like his heart is beating irregularly.  Patient denies chest pain, shortness of breath, difficulty breathing, nausea, vomiting, or diarrhea.  Patient reports a history of the same or similar symptoms over the past 1 to 2 years.  Patient reports he saw cardiology in July 2023, and was offered a heart monitor.  Patient states he declined at that time.  Patient states he normally experiences symptoms when he eats something "heavy".  He states that something heavy in his opinion is like a sweet potato.  Patient reports that he has not eaten anything today, and is continuing to experience symptoms.  Per the patient's chart review, he was seen by Dr. Nelida Gores with heart care, and reported the same or similar symptoms.  Patient is he was offered a heart monitor to wear, but he declined at that time.  Patient states that he does have increased stress because he is currently in school, but does not identify with any other triggers. 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 No pertinent surgical history.     Home Medications    Prior to Admission medications   Medication Sig Start Date End Date Taking? Authorizing Provider  MAGNESIUM CITRATE PO Take 1 tablet by mouth. Pt taking 2-3 times a week     [provider]  Multiple Vitamin (MULTIVITAMIN ADULT PO) Take 1 tablet by mouth daily.    [provider]    Family History Family History  Problem Relation Age of Onset   Thyroid disease Mother    Hypertension Father    Hypertension Paternal Grandmother     Social History Social History   Tobacco Use   Smoking status: Never   Smokeless tobacco: Never  Vaping Use   Vaping Use: Never used  Substance Use Topics   Alcohol use: Yes    Comment: rare   Drug use: No     Allergies   Patient has no known allergies.   Review of Systems Review of Systems Per HPI  Physical Exam Triage Vital Signs ED Triage Vitals  Enc Vitals Group     BP 08/06/22 1752 134/77     Pulse Rate 08/06/22 1752 82     Resp 08/06/22 1752 17     Temp 08/06/22 1752 98.1 F (36.7 C)     Temp Source 08/06/22 1752 Oral     SpO2 08/06/22 1752 100 %     Weight --      Height --      Head Circumference --      Peak Flow --      Pain Score 08/06/22 1755 0     Pain Loc --  Pain Edu? --      Excl. in [place]? --    No data found.  Updated Vital Signs BP 134/77 (BP Location: Right Arm)   Pulse 82   Temp 98.1 F (36.7 C) (Oral)   Resp 17   SpO2 100%   Visual Acuity Right Eye Distance:   Left Eye Distance:   Bilateral Distance:    Right Eye Near:   Left Eye Near:    Bilateral Near:     Physical Exam Vitals and nursing note reviewed.  Constitutional:      General: He is not in acute distress.    Appearance: Normal appearance. He is well-developed.  HENT:     Head: Normocephalic and atraumatic.  Eyes:     Extraocular Movements: Extraocular movements intact.     Conjunctiva/sclera: Conjunctivae normal.     Pupils: Pupils are equal, round, and reactive to light.  Cardiovascular:     Rate and Rhythm: Regular rhythm.     Pulses: Normal pulses.     Heart sounds: Normal heart sounds. No murmur heard. Pulmonary:     Effort: Pulmonary effort is normal. No respiratory  distress.     Breath sounds: Normal breath sounds.  Abdominal:     Palpations: Abdomen is soft.     Tenderness: There is no abdominal tenderness.  Musculoskeletal:        General: No swelling.     Cervical back: Normal range of motion.  Lymphadenopathy:     Cervical: No cervical adenopathy.  Skin:    General: Skin is warm and dry.     Capillary Refill: Capillary refill takes less than 2 seconds.  Neurological:     General: No focal deficit present.     Mental Status: He is alert and oriented to person, place, and time.  Psychiatric:        Mood and Affect: Mood normal.        Behavior: Behavior normal.      UC Treatments / Results  Labs (all labs ordered are listed, but only abnormal results are displayed) Labs Reviewed - No data to display  EKG: Atrial fibrillation, comparison dated 12/10/2021 and 11/14/2020 were reviewed.   Radiology DG Chest 2 View  Result Date: 08/06/2022 CLINICAL DATA:  Constipation. EXAM: CHEST - 2 VIEW COMPARISON:  Chest radiograph dated 09/12/2004. FINDINGS: The heart size and mediastinal contours are within normal limits. Both lungs are clear. The visualized skeletal structures are unremarkable. IMPRESSION: No active cardiopulmonary disease. Electronically Signed   By: Anner Crete M.D.   On: 08/06/2022 18:16    Procedures Procedures (including critical care time)  Medications Ordered in UC Medications - No data to display  Initial Impression / Assessment and Plan / UC Course  I have reviewed the triage vital signs and the nursing notes.  Pertinent labs & imaging results that were available during my care of the patient were reviewed by me and considered in my medical decision making (see chart for details).  The patient is well-appearing, he is in no acute distress, vital signs are stable.  EKG was abnormal showing atrial fibrillation.  Chest x-ray did not reveal any active cardiopulmonary disease.  Because this is a new finding when  reviewing comparisons of the patient's previous EKGs, patient was sent to the emergency department for further evaluation.  Patient is in agreement with this plan of care and verbalizes understanding.  Patient's vital signs are stable at this time, he is able to travel via private  vehicle.  Final Clinical Impressions(s) / UC Diagnoses   Final diagnoses:  Abnormal EKG     Discharge Instructions      Go to the emergency department for further evaluation.      ED Prescriptions   None    PDMP not reviewed this encounter.   Tish Men, NP 08/06/22 [phone]

## 2022-08-06 NOTE — ED Triage Notes (Addendum)
Pt reports "irregular heartbeat" that started earlier this afternoon with intermittent episodes ever since. Also reports bilateral hands have been intermittently "cold/numb".   Denies chest pain/dyspnea at this time. History of similar and was referred to cardiology.

## 2022-08-06 NOTE — ED Triage Notes (Signed)
Pt sent from UC after abnormal EKG showed new onset afib. Pt stated he was feeling generally unwell, some chest palpitations and felt as if heart was racing. Denies N/V/SHOB,  no known sick contacts.  A&O X 4 and ambulatory in triage. Pt moved from FT 21 to rm 7, EKG taken to Gilford Raid, MD. Charge RN aware.  Identical twin has WPW.

## 2022-08-06 NOTE — Discharge Instructions (Signed)
Go to the emergency department for further evaluation. 

## 2022-08-10 ENCOUNTER — Ambulatory Visit: Payer: BC Managed Care – PPO | Admitting: "Endocrinology

## 2022-08-10 ENCOUNTER — Encounter: Payer: Self-pay | Admitting: "Endocrinology

## 2022-08-10 VITALS — BP 114/72 | HR 68 | Ht 71.0 in | Wt 184.0 lb

## 2022-08-10 DIAGNOSIS — Z87898 Personal history of other specified conditions: Secondary | ICD-10-CM

## 2022-08-10 NOTE — Progress Notes (Signed)
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 No pertinent surgical history. Social History   Socioeconomic History   Marital status: Single    Spouse name: Not on file   Number of children: Not on file   Years of education: Not on file   Highest education level: Not on file  Occupational History   Not on file  Tobacco Use   Smoking status: Never   Smokeless tobacco: Never  Vaping Use   Vaping Use: Never used  Substance and Sexual Activity   Alcohol use: Yes    Comment: rare   Drug use: No   Sexual activity: Yes  Other Topics Concern   Not on file  Social History Narrative   Not on file   Social Determinants of Health   Financial Resource Strain: Not on file  Food Insecurity: Not on file  Transportation Needs: Not on file  Physical Activity: Not on file  Stress: Not on file  Social Connections: Not on file   Family History  Problem Relation Age of Onset   Thyroid disease Mother    Hypertension Father    Hypertension Paternal Grandmother    Outpatient Encounter Medications as of 08/10/2022  Medication Sig   cholecalciferol 25 MCG (1000 UT) tablet Take 1,000 Units by mouth daily. Take 1/2 gummy daily   metoprolol succinate (TOPROL-XL) 25 MG 24 hr tablet Take once a day if you develop palpitations again.   Multiple Vitamin (MULTI VITAMIN) TABS 1 tablet Orally Once a day for 30 day(s) (Patient not taking: Reported on 08/06/2022)   Multiple Vitamin (MULTIVITAMIN ADULT PO) Take 1 tablet by mouth daily.   Vibegron (GEMTESA) 75 MG TABS Take 75 mg by mouth as needed (bladder). Take 1/2 tablet as needed for bladder   vitamin C (ASCORBIC  ACID) 250 MG tablet Take 250 mg by mouth daily.   No facility-administered encounter medications on file as of 08/10/2022.   ALLERGIES: No Known Allergies  VACCINATION STATUS: Immunization History  Administered Date(s) Administered   DTaP 01/20/1996, 03/16/1996, 07/03/1996, 02/22/1997, 01/17/2002   HIB (PRP-OMP) 01/20/1996, 03/16/1996, 07/03/1996, 02/22/1997   Hepatitis B 12/01/1995, 07/03/1996   IPV 01/20/1996, 03/16/1996, 02/22/1997   MMR 02/22/1997   Meningococcal Conjugate 02/28/2008, 12/05/2013   Meningococcal polysaccharide vaccine (MPSV4) 02/28/2008   Moderna Sars-Covid-2 Vaccination 10/03/2019, 11/03/2019   Tdap 01/11/2007, 11/23/2021   Varicella 10/19/1996    HPI Peter Durham is 27 y.o. male who presents today with a medical history as above. he is being seen in follow-up after he was seen in consultation for polyuria . See notes from his previous visit.   He has no further complaint of polyuria.  He was put on Gemtesa for overactive bladder which seems to have helped.    More recently, he visited ER for tachyarrhythmia and was diagnosed with atrial fibrillation.  He is scheduled to see cardiologist  next week.  His previsit labs are all favorable including CMP.     He never had documented hypernatremia.  He denies any history of head injury or exposure to lithium. He continued to have leukopenia however improving to 3.6, thrombocytopenia, positive ANA titers.  He has seen a rheumatologist in [place].   His previous CKD is resolved.    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  He is not on medications on regular basis.  He is a non-smoker.  No use of illicit drugs.  He denies any other chronic medical problems such as liver disease nor kidney disease.    Review of Systems  Constitutional: + Recent weight gain,  no fatigue, no subjective hyperthermia, no subjective hypothermia   Objective:        08/10/2022    3:00 PM 08/06/2022    7:47 PM 08/06/2022    5:52 PM  Vitals with BMI  Height '5\' 11"'$     Weight 184 lbs    BMI [id number]    Systolic [id number]  Diastolic 72 91 77  Pulse 68 87 82    BP 114/72   Pulse 68   Ht '5\' 11"'$  (1.803 m)   Wt 184 lb (83.5 kg)   BMI 25.66 kg/m   Wt Readings from Last 3 Encounters:  08/10/22 184 lb (83.5 kg)  01/28/22 174 lb 12.8 oz (79.3 kg)  01/04/22 185 lb 3.2 oz (84 kg)    Physical Exam  Constitutional:  Body mass index is 25.66 kg/m.,  not in acute distress, normal state of mind Eyes: PERRLA, EOMI, no exophthalmos ENT: moist mucous membranes, no gross thyromegaly, no gross cervical lymphadenopathy   CMP ( most recent)  Recent Results (from the past 2160 hour(s))  Basic metabolic panel     Status: None   Collection Time: 08/06/22  8:00 PM  Result Value Ref Range   Sodium 137 135 - 145 mmol/L   Potassium 3.9 3.5 - 5.1 mmol/L   Chloride 102 98 - 111 mmol/L   CO2 26 22 - 32 mmol/L   Glucose, Bld 97 70 - 99 mg/dL    Comment: Glucose reference range applies only to samples taken after fasting for at least 8 hours.   BUN 13 6 - 20 mg/dL   Creatinine, Ser 1.11 0.61 - 1.24 mg/dL   Calcium 9.3 8.9 - 10.3 mg/dL   GFR, Estimated >60 >60 mL/min    Comment: (NOTE) Calculated using the CKD-EPI Creatinine Equation (2021)    Anion gap 9 5 - 15    Comment: Performed at [facility], [address] [place], [place] [address]  Magnesium     Status: None   Collection Time: 08/06/22  8:00 PM  Result Value Ref Range   Magnesium 2.0 1.7 - 2.4 mg/dL    Comment: Performed at [facility], [address], [place], [place] [address]  CBC     Status: Abnormal   Collection Time: 08/06/22  8:00 PM  Result Value Ref Range   WBC 3.6 (L) 4.0 - 10.5 K/uL   RBC 5.78 4.22 - 5.81 MIL/uL   Hemoglobin 16.2 13.0 - 17.0 g/dL   HCT 47.9 39.0 - 52.0 %   MCV 82.9 80.0 - 100.0 fL   MCH 28.0 26.0 - 34.0 pg   MCHC 33.8 30.0 - 36.0 g/dL   RDW 12.2 11.5 - 15.5 %    Platelets 165 150 - 400 K/uL  nRBC 0.0 0.0 - 0.2 %    Comment: Performed at [facility], [address], [place], [place] [address]  TSH     Status: None   Collection Time: 08/06/22  8:00 PM  Result Value Ref Range   TSH 2.934 0.350 - 4.500 uIU/mL    Comment: Performed by a 3rd Generation assay with a functional sensitivity of <=0.01 uIU/mL. Performed at [facility], [address] [place], [place] [address]   Troponin I (High Sensitivity)     Status: None   Collection Time: 08/06/22  8:00 PM  Result Value Ref Range   Troponin I (High Sensitivity) 5 <18 ng/L    Comment: (NOTE) Elevated high sensitivity troponin I (hsTnI) values and significant  changes across serial measurements may suggest ACS but many other  chronic and acute conditions are known to elevate hsTnI results.  Refer to the "Links" section for chest pain algorithms and additional  guidance. Performed at [facility], [address], [place], [place] [address]   Rapid urine drug screen (hospital performed)     Status: None   Collection Time: 08/06/22  8:10 PM  Result Value Ref Range   Opiates NONE DETECTED NONE DETECTED   Cocaine NONE DETECTED NONE DETECTED   Benzodiazepines NONE DETECTED NONE DETECTED   Amphetamines NONE DETECTED NONE DETECTED   Tetrahydrocannabinol NONE DETECTED NONE DETECTED   Barbiturates NONE DETECTED NONE DETECTED    Comment: (NOTE) DRUG SCREEN FOR MEDICAL PURPOSES ONLY.  IF CONFIRMATION IS NEEDED FOR ANY PURPOSE, NOTIFY LAB WITHIN 5 DAYS.  LOWEST DETECTABLE LIMITS FOR URINE DRUG SCREEN Drug Class                     Cutoff (ng/mL) Amphetamine and metabolites    1000 Barbiturate and metabolites    200 Benzodiazepine                 200 Opiates and metabolites        300 Cocaine and metabolites        300 THC                            50 Performed at [facility], [address], [place], [place] [address]   Urinalysis, Routine w reflex microscopic -Urine, Clean Catch     Status:  Abnormal   Collection Time: 08/06/22  8:10 PM  Result Value Ref Range   Color, Urine COLORLESS (A) YELLOW   APPearance CLEAR CLEAR   Specific Gravity, Urine 1.002 (L) 1.005 - 1.030   pH 7.0 5.0 - 8.0   Glucose, UA NEGATIVE NEGATIVE mg/dL   Hgb urine dipstick NEGATIVE NEGATIVE   Bilirubin Urine NEGATIVE NEGATIVE   Ketones, ur NEGATIVE NEGATIVE mg/dL   Protein, ur NEGATIVE NEGATIVE mg/dL   Nitrite NEGATIVE NEGATIVE   Leukocytes,Ua NEGATIVE NEGATIVE    Comment: Performed at [facility], [address], [place], [place] [address]        Lab Results  Component Value Date   TSH 2.934 08/06/2022   TSH 1.540 02/11/2022   TSH 3.487 11/13/2020   FREET4 1.23 02/11/2022           Assessment & Plan:   1. History of polyuria  I reviewed his existing and new results with him.  His labs are not showing evidence of diabetes insipidus.   -He continues to have leukopenia however, this is improving. He has a steady weight since last  couple of visits.  He was recently diagnosed with atrial fibrillation, scheduled to see a cardiologist next week. He does have significant sleep debt. He will not need a specific prescription at this time.  -Healthy diet, sleep hygiene, exercise routine, stress management techniques were all discussed with him.   Whole Foods, Plant-Based Nutrition comprising of fruits and vegetables, plant-based proteins, whole-grain carbohydrates was discussed in detail with the patient.   A list for source of those nutrients were also provided to the patient.  Patient will use only water or unsweetened tea for hydration.  Optimal exercise was discussed with the patient .  His renal function is back to normal, he is advised to avoid over-the-counter nonsteroidal anti-inflammatory medications such as ibuprofen, Aleve.  He is advised to maintain adequate hydration.  He is advised to continue follow-up with his rheumatologist, cardiology, primary care doctor. - I did not  initiate any new prescriptions today. - he is advised to maintain close follow up with his PCP for primary care needs.   I spent 20  minutes in the care of the patient today including review of labs from Thyroid Function, CMP, and other relevant labs ; imaging/biopsy records (current and previous including abstractions from other facilities); face-to-face time discussing  his lab results and symptoms, medications doses, his options of short and long term treatment based on the latest standards of care / guidelines;   and documenting the encounter.  [name]  participated in the discussions, expressed understanding, and voiced agreement with the above plans.  All questions were answered to his satisfaction. he is encouraged to contact clinic should he have any questions or concerns prior to his return visit.    Follow up plan: Return in about 6 months (around 02/10/2023), or Lipids anytime, for F/U with Pre-visit Labs.   Glade Lloyd, MD [facility] [address] [place], [place] [address] Phone: [phone]  Fax: [phone]     08/10/2022, 6:39 PM  This note was partially dictated with voice recognition software. Similar sounding words can be transcribed inadequately or may not  be corrected upon review.

## 2022-08-18 ENCOUNTER — Other Ambulatory Visit (HOSPITAL_COMMUNITY): Payer: Self-pay | Admitting: Physician Assistant

## 2022-08-18 ENCOUNTER — Inpatient Hospital Stay (HOSPITAL_COMMUNITY)
Admission: RE | Admit: 2022-08-18 | Discharge: 2022-08-18 | Disposition: A | Discharge status: Home / Self Care | Payer: BC Managed Care – PPO | Source: Ambulatory Visit | Attending: Physician Assistant | Admitting: Physician Assistant

## 2022-08-18 ENCOUNTER — Ambulatory Visit (HOSPITAL_COMMUNITY)
Admission: RE | Admit: 2022-08-18 | Discharge: 2022-08-18 | Disposition: A | Discharge status: Home / Self Care | Payer: BC Managed Care – PPO | Source: Ambulatory Visit | Attending: Physician Assistant | Admitting: Physician Assistant

## 2022-08-18 ENCOUNTER — Encounter (HOSPITAL_COMMUNITY): Payer: Self-pay | Admitting: Physician Assistant

## 2022-08-18 VITALS — BP 128/90 | HR 59 | Ht 71.0 in | Wt 186.0 lb

## 2022-08-18 DIAGNOSIS — R002 Palpitations: Secondary | ICD-10-CM | POA: Insufficient documentation

## 2022-08-18 DIAGNOSIS — I480 Paroxysmal atrial fibrillation: Secondary | ICD-10-CM | POA: Diagnosis not present

## 2022-08-18 DIAGNOSIS — Z8249 Family history of ischemic heart disease and other diseases of the circulatory system: Secondary | ICD-10-CM

## 2022-08-18 NOTE — Addendum Note (Signed)
Encounter addended by: Juluis Mire, RN on: 08/18/2022 10:13 AM  Actions taken: Order list changed, Diagnosis association updated

## 2022-08-18 NOTE — Progress Notes (Signed)
Primary Care Physician: Nita Sickle, NP Primary Cardiologist: Dr Domenic Polite Primary Electrophysiologist: none Referring Physician: [facility]   Peter Durham is a 27 y.o. male with a history of atrial fibrillation who presents for consultation in the [facility].  The patient was initially diagnosed with atrial fibrillation 08/06/22 after presenting to urgent care with symptoms of palpitations. ECG showed afib and he was sent to the ED for evaluation. He spontaneously converted to SR at the ED. Patient has a CHADS2VASC score of 0. He was previously evaluated by Dr Domenic Polite who recommended cardiac monitor and stress test, patient deferred at that time.   Today, patient continues to have brief palpitations several times per day. No prolonged episodes like the day that took him to the ED. He wonders if his digestive issues have had any impact on his afib. He has an identical twin brother who has been diagnosed with WPW. He has not taken any PRN BB.   Today, he denies symptoms of chest pain, shortness of breath, orthopnea, PND, lower extremity edema, dizziness, presyncope, syncope, snoring, daytime somnolence, bleeding, or neurologic sequela. The patient is tolerating medications without difficulties and is otherwise without complaint today.    Atrial Fibrillation Risk Factors:  he does not have symptoms or diagnosis of sleep apnea. he does not have a history of rheumatic fever. he does not have a history of alcohol use. The patient does have a history of early familial atrial fibrillation or other arrhythmias. Twin brother has WPW.  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  Current Outpatient Medications  Medication Sig Dispense Refill   cholecalciferol 25 MCG (1000 UT) tablet Take 1,000 Units by mouth daily. Take 1/2 gummy daily     metoprolol succinate (TOPROL-XL) 25 MG 24 hr tablet Take once a day if you develop palpitations again. 15 tablet 0   Multiple Vitamin (MULTIVITAMIN ADULT PO) Take 1 tablet by mouth daily.     Vibegron (GEMTESA) 75 MG TABS Take 75 mg by mouth as needed (bladder). Take 1/2 tablet as needed for bladder     vitamin C (ASCORBIC ACID) 250 MG tablet Take 250 mg by mouth daily.     No current facility-administered medications for this encounter.    No Known Allergies  Social History   Socioeconomic History   Marital status: Single    Spouse name: Not on file   Number of children: Not on file   Years of education: Not on file   Highest education level: Not on file  Occupational History   Not on file  Tobacco Use   Smoking status: Never   Smokeless tobacco: Never   Tobacco comments:    Never smoke 08/18/22  Vaping Use   Vaping Use: Never used  Substance and Sexual Activity   Alcohol use: Yes  Comment: rare   Drug use: No   Sexual activity: Yes  Other Topics Concern   Not on file  Social History Narrative   Not on file   Social Determinants of Health   Financial Resource Strain: Not on file  Food Insecurity: Not on file  Transportation Needs: Not on file  Physical Activity: Not on file  Stress: Not on file  Social Connections: Not on file  Intimate Partner Violence: Not on file     ROS- All systems are reviewed and negative except as per the HPI above.  Physical Exam: Vitals:   08/18/22 0907   BP: (!) 128/90  Pulse: (!) 59  Weight: 84.4 kg  Height: '5\' 11"'$  (1.803 m)    GEN- The patient is a well appearing male, alert and oriented x 3 today.   Head- normocephalic, atraumatic Eyes-  Sclera clear, conjunctiva pink Ears- hearing intact Oropharynx- clear Neck- supple  Lungs- Clear to ausculation bilaterally, normal work of breathing Heart- Regular rate and rhythm, no murmurs, rubs or gallops  GI- soft, NT, ND, + BS Extremities- no clubbing, cyanosis, or edema MS- no significant deformity or atrophy Skin- no rash or lesion Psych- euthymic mood, full affect Neuro- strength and sensation are intact  Wt Readings from Last 3 Encounters:  08/18/22 84.4 kg  08/10/22 83.5 kg  01/28/22 79.3 kg    EKG today demonstrates  SB Vent. rate 59 BPM PR interval 138 ms QRS duration 102 ms QT/QTcB 366/362 ms  Echo 02/04/22 demonstrated   1. Left ventricular ejection fraction, by estimation, is 55 to 60%. The  left ventricle has normal function. The left ventricle has no regional  wall motion abnormalities. Left ventricular diastolic parameters were  normal. The average left ventricular  global longitudinal strain is -18.9 %. The global longitudinal strain is  normal.   2. Right ventricular systolic function is normal. The right ventricular  size is normal. Tricuspid regurgitation signal is inadequate for assessing  PA pressure.   3. The mitral valve is normal in structure. Trivial mitral valve  regurgitation. No evidence of mitral stenosis.   4. The aortic valve is tricuspid. Aortic valve regurgitation is not  visualized. No aortic stenosis is present.   5. The inferior vena cava is normal in size with greater than 50%  respiratory variability, suggesting right atrial pressure of 3 mmHg.    Epic records are reviewed at length today  CHA2DS2-VASc Score = 0  The patient's score is based upon: CHF History: 0 HTN History: 0 Diabetes History: 0 Stroke History: 0 Vascular  Disease History: 0 Age Score: 0 Gender Score: 0       ASSESSMENT AND PLAN: 1. Paroxysmal Atrial Fibrillation (ICD10:  I48.0) The patient's CHA2DS2-VASc score is 0, indicating a 0.2% annual risk of stroke.   General education about afib provided and questions answered. We also discussed his stroke risk and the risks and benefits of anticoagulation. Anticoagulation not indicated at this time with low CV score.  Patient having palpitations several times per day.  Will order a 2 week Zio monitor to evaluate his arrhythmia burden.  Patient agreeable to rescheduling TST to evaluate for exercise induced arrhythmias. Given his new diagnosis of afib and family history of WPW, will refer him to establish care with EP. PR interval appropriate, no delta waves.  ? If EP study would be beneficial.   Continue Toprol 25 mg daily PRN    Follow up with EP to establish care.  [facility] [address] [place], [place] [address] [phone] 08/18/2022 9:37 AM

## 2022-08-23 DIAGNOSIS — Z87898 Personal history of other specified conditions: Secondary | ICD-10-CM | POA: Diagnosis not present

## 2022-08-24 LAB — COMPREHENSIVE METABOLIC PANEL
ALT: 19 IU/L (ref 0–44)
AST: 19 IU/L (ref 0–40)
Albumin/Globulin Ratio: 1.8 (ref 1.2–2.2)
Albumin: 4.8 g/dL (ref 4.3–5.2)
Alkaline Phosphatase: 84 IU/L (ref 44–121)
BUN/Creatinine Ratio: 8 — ABNORMAL LOW (ref 9–20)
BUN: 10 mg/dL (ref 6–20)
Bilirubin Total: 1.4 mg/dL — ABNORMAL HIGH (ref 0.0–1.2)
CO2: 24 mmol/L (ref 20–29)
Calcium: 9.8 mg/dL (ref 8.7–10.2)
Chloride: 103 mmol/L (ref 96–106)
Creatinine, Ser: 1.29 mg/dL — ABNORMAL HIGH (ref 0.76–1.27)
Globulin, Total: 2.7 g/dL (ref 1.5–4.5)
Glucose: 89 mg/dL (ref 70–99)
Potassium: 4.3 mmol/L (ref 3.5–5.2)
Sodium: 142 mmol/L (ref 134–144)
Total Protein: 7.5 g/dL (ref 6.0–8.5)
eGFR: 78 mL/min/{1.73_m2} (ref >59)

## 2022-08-24 LAB — LIPID PANEL
Chol/HDL Ratio: 3.5 ratio (ref 0.0–5.0)
Cholesterol, Total: 182 mg/dL (ref 100–199)
HDL: 52 mg/dL (ref >39)
LDL Chol Calc (NIH): 119 mg/dL — ABNORMAL HIGH (ref 0–99)
Triglycerides: 58 mg/dL (ref 0–149)
VLDL Cholesterol Cal: 11 mg/dL (ref 5–40)

## 2022-09-03 ENCOUNTER — Telehealth (HOSPITAL_COMMUNITY): Payer: Self-pay | Admitting: *Deleted

## 2022-09-03 NOTE — Telephone Encounter (Signed)
Reminder and instructions call given for upcoming ETT on 09/08/2022.

## 2022-09-07 DIAGNOSIS — I480 Paroxysmal atrial fibrillation: Secondary | ICD-10-CM | POA: Diagnosis not present

## 2022-09-08 ENCOUNTER — Ambulatory Visit (HOSPITAL_COMMUNITY): Payer: BC Managed Care – PPO | Attending: Physician Assistant

## 2022-09-08 ENCOUNTER — Encounter (HOSPITAL_COMMUNITY): Payer: Self-pay | Admitting: *Deleted

## 2022-09-08 DIAGNOSIS — Z8249 Family history of ischemic heart disease and other diseases of the circulatory system: Secondary | ICD-10-CM | POA: Diagnosis not present

## 2022-09-08 DIAGNOSIS — I480 Paroxysmal atrial fibrillation: Secondary | ICD-10-CM | POA: Insufficient documentation

## 2022-09-08 LAB — EXERCISE TOLERANCE TEST
Estimated workload: 14.9
Exercise duration (min): 12 min
Exercise duration (sec): 48 s
MPHR: 194 {beats}/min
Peak HR: 200 {beats}/min
Percent HR: 103 %
Rest HR: 82 {beats}/min
ST Depression (mm): 0 mm

## 2022-09-08 NOTE — Addendum Note (Signed)
Encounter addended by: Juluis Mire, RN on: 09/08/2022 9:19 AM  Actions taken: Imaging Exam ended

## 2022-09-17 ENCOUNTER — Encounter: Payer: Self-pay | Admitting: Cardiovascular Disease

## 2022-09-17 ENCOUNTER — Ambulatory Visit: Payer: BC Managed Care – PPO | Attending: Cardiovascular Disease | Admitting: Cardiovascular Disease

## 2022-09-17 VITALS — BP 118/72 | HR 61 | Ht 71.0 in | Wt 186.8 lb

## 2022-09-17 DIAGNOSIS — I480 Paroxysmal atrial fibrillation: Secondary | ICD-10-CM

## 2022-09-17 NOTE — Patient Instructions (Signed)
Medication Instructions:  Your physician recommends that you continue on your current medications as directed. Please refer to the Current Medication list given to you today. *If you need a refill on your cardiac medications before your next appointment, please call your pharmacy*   Follow-Up: At [facility], you and your health needs are our priority.  As part of our continuing mission to provide you with exceptional heart care, we have created designated Provider Care Teams.  These Care Teams include your primary Cardiologist (physician) and Advanced Practice Providers (APPs -  Physician Assistants and Nurse Practitioners) who all work together to provide you with the care you need, when you need it.  We recommend signing up for the patient portal called "MyChart".  Sign up information is provided on this After Visit Summary.  MyChart is used to connect with patients for Virtual Visits (Telemedicine).  Patients are able to view lab/test results, encounter notes, upcoming appointments, etc.  Non-urgent messages can be sent to your provider as well.   To learn more about what you can do with MyChart, go to [email or web address].    Your next appointment:   6 month(s)  Provider:   Augustus Mealor, MD  

## 2022-09-17 NOTE — Progress Notes (Signed)
Electrophysiology Office Note:    Date:  09/17/2022   ID:  Peter Durham, DOB [birth year], MRN [id number]  PCP:  Rondel Oh, NP   [facility] Providers Cardiologist:  Nona Dell, MD     Referring MD: Danice Goltz, PA   History of Present Illness:    Peter Durham is a 27 y.o. male with little past medical history, referred for arrhythmia management.  He presented to urgent care clinic on March 1 complaining of palpitations and was diagnosed with new-onset atrial fibrillation. He was referred to the ED, where he converted to sinus rhythm spontaneously. He has continued to have episodes of palpitations.  He saw Dr. Diona Browner who recommended stress test and cardiac monitor, but the patient declined.  Today, he tells me that he has had multiple episodes of rapid palpitations in the past.  These occur infrequently, just a few times a year.  In every instance, the episodes were preceded by GI distress brought on by either eating spicy or acidic food, or a lot of bread.  He is under the care of a gastroenterologist.  He has an identical twin brother who was diagnosed with WPW and has been seen by Dr. Lalla Brothers -- the pathway appears to be low risk by ETT.  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 No pertinent surgical history.  Current Medications: Current Meds  Medication Sig   cholecalciferol 25 MCG (1000 UT) tablet Take 1,000 Units by mouth daily. Take 1/2 gummy daily   metoprolol succinate (TOPROL-XL) 25 MG 24 hr tablet Take once a day if you develop palpitations again.   Multiple Vitamin (MULTIVITAMIN ADULT PO) Take 1 tablet by mouth daily.   Vibegron (GEMTESA) 75 MG TABS Take 75 mg by mouth as needed (bladder). Take 1/2 tablet as needed for bladder   vitamin C (ASCORBIC ACID) 250 MG tablet Take 250 mg by mouth daily.      Allergies:   Patient has no known allergies.   Social and Family History: Reviewed in Epic  ROS:   Please see the history of present illness.    All other systems reviewed and are negative.  EKGs/Labs/Other Studies Reviewed Today:    Echocardiogram:  TTE 02/05/2022  1. Left ventricular ejection fraction, by estimation, is 55 to 60%. The  left ventricle has normal function. The left ventricle has no regional  wall motion abnormalities. Left ventricular diastolic parameters were  normal. The average left ventricular  global longitudinal strain is -18.9 %. The global longitudinal strain is  normal.   2. Right ventricular systolic function is normal. The right ventricular  size is normal. Tricuspid regurgitation signal is inadequate for assessing  PA pressure.   3. The mitral valve is normal in structure. Trivial mitral valve  regurgitation. No evidence of mitral stenosis.   4. The aortic valve is tricuspid. Aortic valve regurgitation is not  visualized. No aortic stenosis is present.   5. The inferior vena cava is normal in size with greater than 50%  respiratory variability, suggesting right atrial pressure of 3 mmHg.    Monitors:  Zio monitor Sinus rhythm HR 42-145, avg 70 Skipped beats were associated with PVCs. PVC burden was < 1%.   Patch Wear Time:  13 days and 23 hours (2024-03-13T09:44:12-398 to 2024-03-27T09:43:59-0400)  Stress testing:  ETT  09/08/2022 No induced arrhythmias, no ischemia     Advanced imaging:   EKG:  Last EKG results: today - sinus rhythm    Recent Labs: 08/06/2022: Hemoglobin 16.2; Magnesium 2.0; Platelets 165; TSH 2.934 08/23/2022: ALT 19; BUN 10; Creatinine, Ser 1.29; Potassium 4.3; Sodium 142     Physical Exam:    VS:  BP 118/72   Pulse 61   Ht  (1.803 m)   Wt 186 lb 12.8 oz (84.7 kg)   SpO2 98%   BMI 26.05 kg/m     Wt Readings from Last 3 Encounters:  09/17/22 186 lb 12.8 oz (84.7 kg)  08/18/22 186 lb (84.4 kg)   08/10/22 184 lb (83.5 kg)     GEN: Well nourished, well developed in no acute distress CARDIAC: RRR, no murmurs, rubs, gallops RESPIRATORY:  Normal work of breathing MUSCULOSKELETAL: no edema    ASSESSMENT & PLAN:    Paroxysmal atrial fibrillation One documented episode appears to be AF CHADS2VASC is zero I explained that atrial fibrillation is very unusual in an otherwise healthy 27 year old.  I think EP study versus placement of a loop monitor would be reasonable to see if there is a trigger for A-fib, such as a concealed accessory pathway or AVNRT. At this time, he attributes his arrhythmia to digestive issues and prefers to see his gastroenterologist and modify his diet to avoid triggers.  Twin brother with WPW  Pathway was deemed low risk by ETT   Follow-up 6 months         Medication Adjustments/Labs and Tests Ordered: Current medicines are reviewed at length with the patient today.  Concerns regarding medicines are outlined above.  Orders Placed This Encounter  Procedures   EKG 12-Lead   No orders of the defined types were placed in this encounter.    Signed, Maurice Small, MD  09/17/2022 1:09 PM    [facility]

## 2022-10-01 ENCOUNTER — Encounter (HOSPITAL_COMMUNITY): Payer: Self-pay

## 2022-10-01 ENCOUNTER — Other Ambulatory Visit: Payer: Self-pay

## 2022-10-01 ENCOUNTER — Emergency Department (HOSPITAL_COMMUNITY)
Admission: EM | Admit: 2022-10-01 | Discharge: 2022-10-01 | Disposition: A | Discharge status: Home / Self Care | Payer: BC Managed Care – PPO | Attending: Emergency Medicine | Admitting: Emergency Medicine

## 2022-10-01 ENCOUNTER — Emergency Department (HOSPITAL_COMMUNITY): Payer: BC Managed Care – PPO

## 2022-10-01 DIAGNOSIS — R0789 Other chest pain: Secondary | ICD-10-CM | POA: Insufficient documentation

## 2022-10-01 DIAGNOSIS — R079 Chest pain, unspecified: Secondary | ICD-10-CM | POA: Diagnosis not present

## 2022-10-01 DIAGNOSIS — I10 Essential (primary) hypertension: Secondary | ICD-10-CM | POA: Diagnosis not present

## 2022-10-01 LAB — BASIC METABOLIC PANEL
Anion gap: 12 (ref 5–15)
BUN: 7 mg/dL (ref 6–20)
CO2: 27 mmol/L (ref 22–32)
Calcium: 9.6 mg/dL (ref 8.9–10.3)
Chloride: 97 mmol/L — ABNORMAL LOW (ref 98–111)
Creatinine, Ser: 1.24 mg/dL (ref 0.61–1.24)
GFR, Estimated: >60 mL/min (ref >60)
Glucose, Bld: 111 mg/dL — ABNORMAL HIGH (ref 70–99)
Potassium: 3.6 mmol/L (ref 3.5–5.1)
Sodium: 136 mmol/L (ref 135–145)

## 2022-10-01 LAB — CBC
HCT: 45 % (ref 39.0–52.0)
Hemoglobin: 15.3 g/dL (ref 13.0–17.0)
MCH: 27.7 pg (ref 26.0–34.0)
MCHC: 34 g/dL (ref 30.0–36.0)
MCV: 81.5 fL (ref 80.0–100.0)
Platelets: 152 10*3/uL (ref 150–400)
RBC: 5.52 MIL/uL (ref 4.22–5.81)
RDW: 11.8 % (ref 11.5–15.5)
WBC: 4.3 10*3/uL (ref 4.0–10.5)
nRBC: 0 % (ref 0.0–0.2)

## 2022-10-01 LAB — TROPONIN I (HIGH SENSITIVITY)
Troponin I (High Sensitivity): 4 ng/L (ref <18)
Troponin I (High Sensitivity): 4 ng/L (ref <18)

## 2022-10-01 MED ORDER — ONDANSETRON 4 MG PO TBDP
8.0000 mg | ORAL_TABLET | Freq: Once | ORAL | Status: AC
  Administered 2022-10-01: 8 mg via ORAL
  Filled 2022-10-01: qty 2

## 2022-10-01 NOTE — ED Triage Notes (Signed)
Patient complaint of non radiating center chest pressure and slow heart rate, & high BP. Reports nausea.  Denies SOB, vomiting  Reports taking 25 mg metoprolol ER at 2250 tonight.   Pain 5/10

## 2022-10-01 NOTE — Discharge Instructions (Addendum)
Schedule follow-up with your cardiologist.  It would likely help if you take the metoprolol once a day for the next few days.

## 2022-10-01 NOTE — ED Provider Notes (Signed)
[place] EMERGENCY DEPARTMENT AT [facility] Provider Note   CSN: [id number] Arrival date & time: 10/01/22  0032     History  Chief Complaint  Patient presents with   Chest Pain    Peter Durham is a 27 y.o. male.  Patient presents to the emergency department for evaluation of chest tightness.  Patient reports symptoms began earlier today.  He did take his as needed metoprolol when the pain began and he feels like symptoms are easing off.  No associated shortness of breath.  Patient has had episodes of palpitations and chest discomfort over the last couple of years.  Symptoms seem to be related to GI distress in general.  He has had thorough cardiac workup.  Patient reports that he has had nausea and has not been able to eat much today.  No vomiting.  No abdominal pain.  He reports that he just finished finals at school and he has been under a lot of stress.       Home Medications Prior to Admission medications   Medication Sig Start Date End Date Taking? Authorizing Provider  cholecalciferol 25 MCG (1000 UT) tablet Take 1,000 Units by mouth daily. Take 1/2 gummy daily    [provider]  metoprolol succinate (TOPROL-XL) 25 MG 24 hr tablet Take once a day if you develop palpitations again. 08/06/22   Jacalyn Lefevre, MD  Multiple Vitamin (MULTIVITAMIN ADULT PO) Take 1 tablet by mouth daily.    [provider]  Vibegron (GEMTESA) 75 MG TABS Take 75 mg by mouth as needed (bladder). Take 1/2 tablet as needed for bladder    [provider]  vitamin C (ASCORBIC ACID) 250 MG tablet Take 250 mg by mouth daily.    [provider]      Allergies    Patient has no known allergies.    Review of Systems   Review of Systems  Physical Exam Updated Vital Signs BP 138/73   Pulse 85   Temp 98.3 F (36.8 C)   Resp 18   Ht 5\' 10"  (1.778 m)   Wt 85 kg   SpO2 100%   BMI 26.89 kg/m  Physical Exam Vitals and nursing note reviewed.   Constitutional:      General: He is not in acute distress.    Appearance: He is well-developed.  HENT:     Head: Normocephalic and atraumatic.     Mouth/Throat:     Mouth: Mucous membranes are moist.  Eyes:     General: Vision grossly intact. Gaze aligned appropriately.     Extraocular Movements: Extraocular movements intact.     Conjunctiva/sclera: Conjunctivae normal.  Cardiovascular:     Rate and Rhythm: Normal rate and regular rhythm.     Pulses: Normal pulses.     Heart sounds: Normal heart sounds, S1 normal and S2 normal. No murmur heard.    No friction rub. No gallop.  Pulmonary:     Effort: Pulmonary effort is normal. No respiratory distress.     Breath sounds: Normal breath sounds.  Abdominal:     Palpations: Abdomen is soft.     Tenderness: There is no abdominal tenderness. There is no guarding or rebound.     Hernia: No hernia is present.  Musculoskeletal:        General: No swelling.     Cervical back: Full passive range of motion without pain, normal range of motion and neck supple. No pain with movement, spinous process tenderness or  muscular tenderness. Normal range of motion.     Right lower leg: No edema.     Left lower leg: No edema.  Skin:    General: Skin is warm and dry.     Capillary Refill: Capillary refill takes less than 2 seconds.     Findings: No ecchymosis, erythema, lesion or wound.  Neurological:     Mental Status: He is alert and oriented to person, place, and time.     GCS: GCS eye subscore is 4. GCS verbal subscore is 5. GCS motor subscore is 6.     Cranial Nerves: Cranial nerves 2-12 are intact.     Sensory: Sensation is intact.     Motor: Motor function is intact. No weakness or abnormal muscle tone.     Coordination: Coordination is intact.  Psychiatric:        Mood and Affect: Mood normal.        Speech: Speech normal.        Behavior: Behavior normal.     ED Results / Procedures / Treatments   Labs (all labs ordered are listed,  but only abnormal results are displayed) Labs Reviewed  BASIC METABOLIC PANEL - Abnormal; Notable for the following components:      Result Value   Chloride 97 (*)    Glucose, Bld 111 (*)    All other components within normal limits  CBC  TROPONIN I (HIGH SENSITIVITY)    EKG EKG Interpretation  Date/Time:  Friday October 01 2022 00:38:20 EDT Ventricular Rate:  76 PR Interval:  144 QRS Duration: 100 QT Interval:  352 QTC Calculation: 396 R Axis:   83 Text Interpretation: Normal sinus rhythm with sinus arrhythmia T wave abnormality, consider inferior ischemia Abnormal ECG When compared with ECG of 18-Aug-2022 09:09, no change Confirmed by Gilda Crease [phone]) on 10/01/2022 1:53:03 AM  Radiology DG Chest 2 View  Result Date: 10/01/2022 CLINICAL DATA:  Chest pain, hypertension, heaviness in legs and arms. EXAM: CHEST - 2 VIEW COMPARISON:  08/06/2022. FINDINGS: The heart size and mediastinal contours are within normal limits. Both lungs are clear. No acute osseous abnormality. IMPRESSION: No active cardiopulmonary disease. Electronically Signed   By: Thornell Sartorius M.D.   On: 10/01/2022 01:01    Procedures Procedures    Medications Ordered in ED Medications  ondansetron (ZOFRAN-ODT) disintegrating tablet 8 mg (has no administration in time range)    ED Course/ Medical Decision Making/ A&P                             Medical Decision Making Amount and/or Complexity of Data Reviewed Labs: ordered. Radiology: ordered.  Risk Prescription drug management.   Differential Diagnosis considered includes, but not limited to: STEMI; NSTEMI; myocarditis; pericarditis; pulmonary embolism; aortic dissection; pneumothorax; pneumonia; gastritis; musculoskeletal pain  Presents with chest pain.  Patient has a history of paroxysmal atrial fibrillation, having 1 episode of A-fib that spontaneously resolved some years ago.  CHA2DS2-VASc is 0.  He is not on anticoagulation.  Patient  has been prescribed atenolol 25 mg tablets to be used once a day as needed for palpitations.  He took 1 tonight and he is feeling better now.  I reviewed his records.  He has had long-term monitoring with a Zio patch.  He had a stress test last month that was reassuring.  No arrhythmias noted today on EKG or on telemetry.  Troponins negative.  Patient feels that his  symptoms are always related to GI.  He can follow-up with his gastroenterologist.  He also is under a lot of stress.  I recommended that he take the atenolol daily for the next couple of days until things settle down.  Given return precautions.        Final Clinical Impression(s) / ED Diagnoses Final diagnoses:  Atypical chest pain    Rx / DC Orders ED Discharge Orders     None         Yonah Tangeman, Canary Brim, MD 10/01/22 [phone]

## 2022-10-11 ENCOUNTER — Encounter: Payer: Self-pay | Admitting: "Endocrinology

## 2022-10-12 ENCOUNTER — Encounter: Payer: Self-pay | Admitting: "Endocrinology

## 2022-10-12 ENCOUNTER — Other Ambulatory Visit: Payer: Self-pay | Admitting: "Endocrinology

## 2022-10-12 DIAGNOSIS — E270 Other adrenocortical overactivity: Secondary | ICD-10-CM

## 2023-02-10 ENCOUNTER — Ambulatory Visit: Payer: BC Managed Care – PPO | Admitting: "Endocrinology

## 2023-05-25 ENCOUNTER — Ambulatory Visit: Payer: BC Managed Care – PPO | Admitting: "Endocrinology

## 2023-11-22 ENCOUNTER — Ambulatory Visit: Payer: Self-pay | Attending: Cardiovascular Disease | Admitting: Cardiovascular Disease

## 2023-11-22 ENCOUNTER — Encounter: Payer: Self-pay | Admitting: Cardiovascular Disease

## 2023-11-22 VITALS — BP 118/68 | HR 60 | Ht 70.0 in | Wt 180.0 lb

## 2023-11-22 DIAGNOSIS — R002 Palpitations: Secondary | ICD-10-CM

## 2023-11-22 DIAGNOSIS — Z8249 Family history of ischemic heart disease and other diseases of the circulatory system: Secondary | ICD-10-CM | POA: Diagnosis not present

## 2023-11-22 DIAGNOSIS — I480 Paroxysmal atrial fibrillation: Secondary | ICD-10-CM | POA: Diagnosis not present

## 2023-11-22 NOTE — Patient Instructions (Signed)
 Medication Instructions:  Your physician recommends that you continue on your current medications as directed. Please refer to the Current Medication list given to you today. *If you need a refill on your cardiac medications before your next appointment, please call your pharmacy*   Follow-Up: At [facility], you and your health needs are our priority.  As part of our continuing mission to provide you with exceptional heart care, our providers are all part of one team.  This team includes your primary Cardiologist (physician) and Advanced Practice Providers or APPs (Physician Assistants and Nurse Practitioners) who all work together to provide you with the care you need, when you need it.  Your next appointment:   As needed   Provider:   Marlane Silver, MD

## 2023-11-22 NOTE — Progress Notes (Signed)
 Electrophysiology Office Note:    Date:  11/22/2023   ID:  Peter Durham, DOB 1995/12/15, MRN [id number]  PCP:  Jaclyn Massy, NP   [facility] Providers Cardiologist:  Teddie Favre, MD Electrophysiologist:  Efraim Grange, MD     Referring MD: Jaclyn Massy, NP   History of Present Illness:    Peter Durham is a 28 y.o. male with little past medical history, referred for arrhythmia management.  Peter Durham presented to urgent care clinic on March 1 complaining of palpitations and was diagnosed with new-onset atrial fibrillation. Peter Durham was referred to the ED, where Peter Durham converted to sinus rhythm spontaneously. Peter Durham has continued to have episodes of palpitations.  Peter Durham saw Dr. Londa Rival who recommended stress test and cardiac monitor, but the patient declined.  Today, Peter Durham tells me that Peter Durham has had multiple episodes of rapid palpitations in the past.  These occur infrequently, just a few times a year.  In every instance, the episodes were preceded by GI distress brought on by either eating spicy or acidic food, or a lot of bread.  Peter Durham is under the care of a gastroenterologist.  Peter Durham has an identical twin brother who was diagnosed with WPW and has been seen by Dr. Marven Slimmer -- the pathway appears to be low risk by ETT.  Since his last visit, Peter Durham has struggled quite a bit with digestive issues and has radically modified his diet.  This appears to have helped his rhythm issue and BP fluctuations. His palpitations have essentially resolved; Peter Durham hasn't had any symptoms similar to those Peter Durham experienced during AF.  EKGs/Labs/Other Studies Reviewed Today:    Echocardiogram:  TTE 02/05/2022  1. Left ventricular ejection fraction, by estimation, is 55 to 60%. The  left ventricle has normal function. The left ventricle has no regional  wall motion abnormalities. Left ventricular diastolic parameters were  normal. The average left ventricular  global longitudinal strain is -18.9 %. The global  longitudinal strain is  normal.   2. Right ventricular systolic function is normal. The right ventricular  size is normal. Tricuspid regurgitation signal is inadequate for assessing  PA pressure.   3. The mitral valve is normal in structure. Trivial mitral valve  regurgitation. No evidence of mitral stenosis.   4. The aortic valve is tricuspid. Aortic valve regurgitation is not  visualized. No aortic stenosis is present.   5. The inferior vena cava is normal in size with greater than 50%  respiratory variability, suggesting right atrial pressure of 3 mmHg.    Monitors:  Zio monitor Sinus rhythm HR 42-145, avg 70 Skipped beats were associated with PVCs. PVC burden was < 1%.   Patch Wear Time:  13 days and 23 hours (2024-03-13T09:44:12-398 to 2024-03-27T09:43:59-0400)  Stress testing:  ETT 09/08/2022 No induced arrhythmias, no ischemia     Advanced imaging:   EKG:  Last EKG results: today - sinus rhythm    Recent Labs: No results found for requested labs within last 365 days.     Physical Exam:    VS:  BP 118/68 (BP Location: Right Arm, Patient Position: Sitting, Cuff Size: Normal)   Pulse 60   Ht 5' 10 (1.778 m)   Wt 180 lb (81.6 kg)   SpO2 99%   BMI 25.83 kg/m     Wt Readings from Last 3 Encounters:  11/22/23 180 lb (81.6 kg)  10/01/22 187 lb 6.3 oz (85 kg)  09/17/22 186 lb 12.8 oz (84.7 kg)     GEN:  Well nourished, well developed in no acute distress CARDIAC: RRR, no murmurs, rubs, gallops RESPIRATORY:  Normal work of breathing MUSCULOSKELETAL: no edema    ASSESSMENT & PLAN:    Paroxysmal atrial fibrillation One documented episode appears to be AF CHADS2VASC is zero I explained that atrial fibrillation is very unusual in an otherwise healthy 28 year old.  I think EP study versus placement of a loop monitor would be reasonable to see if there is a trigger for A-fib, such as a concealed accessory pathway or AVNRT. Palpitations have essentially  resolved with dietary changes.  Peter Durham was symptomatic with AF and aware of his arrhythmia; I think Peter Durham is reliable to call us  if Peter Durham has recurrence We discussed the utility of an at-home device such as an AliveCor to document arrhythmia with an ECG.  Twin brother with WPW  Pathway was deemed low risk by ETT   Follow-up as needed         Medication Adjustments/Labs and Tests Ordered: Current medicines are reviewed at length with the patient today.  Concerns regarding medicines are outlined above.  Orders Placed This Encounter  Procedures   EKG 12-Lead   No orders of the defined types were placed in this encounter.    Signed, Efraim Grange, MD  11/22/2023 4:41 PM    [facility]

## 2024-06-03 ENCOUNTER — Ambulatory Visit
Admission: EM | Admit: 2024-06-03 | Discharge: 2024-06-03 | Disposition: A | Discharge status: Home / Self Care | Attending: Family Medicine | Admitting: Family Medicine

## 2024-06-03 DIAGNOSIS — R600 Localized edema: Secondary | ICD-10-CM

## 2024-06-03 DIAGNOSIS — K047 Periapical abscess without sinus: Secondary | ICD-10-CM | POA: Diagnosis not present

## 2024-06-03 MED ORDER — AMOXICILLIN-POT CLAVULANATE 875-125 MG PO TABS: 1.0000 | ORAL_TABLET | Freq: Two times a day (BID) | ORAL | 0 refills | Status: DC

## 2024-06-03 MED ORDER — LIDOCAINE VISCOUS HCL 2 % MT SOLN: 10.0000 mL | OROMUCOSAL | 0 refills | Status: AC | PRN

## 2024-06-03 NOTE — ED Triage Notes (Signed)
 Pt reports facial swelling, and tenderness on the left side, pain and difficulty moving the mouth. Started at 3 am.

## 2024-06-03 NOTE — ED Provider Notes (Signed)
 " [facility]    CSN: [id number] Arrival date & time: 06/03/24  1459      History   Chief Complaint No chief complaint on file.   HPI Peter Durham is a 28 y.o. male.   Patient presenting today with left lower posterior molar dental pain, facial swelling to the side, difficulty opening the jaw due to the pain and swelling.  Denies fever, chills, bleeding, drainage, difficulty breathing or swallowing, nausea, vomiting.  So far trying over-the-counter pain relievers with minimal relief.  Has a known dental issue in this area.    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 No pertinent surgical history.     Home Medications    Prior to Admission medications  Medication Sig Start Date End Date Taking? Authorizing Provider  amoxicillin -clavulanate (AUGMENTIN ) 875-125 MG tablet Take 1 tablet by mouth every 12 (twelve) hours. 06/03/24  Yes Stuart Vernell Norris, PA-C  lidocaine  (XYLOCAINE ) 2 % solution Use as directed 10 mLs in the mouth or throat every 3 (three) hours as needed. 06/03/24  Yes Stuart Vernell Norris, PA-C  cholecalciferol 25 MCG (1000 UT) tablet Take 1,000 Units by mouth daily. Take 1/2 gummy daily    [provider]  metoprolol  succinate (TOPROL -XL) 25 MG 24 hr tablet Take once a day if you develop palpitations again. 08/06/22   Haviland, Julie, MD  Multiple Vitamin (MULTIVITAMIN ADULT PO) Take 1 tablet by mouth daily.    [provider]  vitamin C (ASCORBIC ACID) 250 MG tablet Take 250 mg by mouth daily.    [provider]    Family History Family History  Problem Relation Age of Onset   Thyroid  disease Mother    Hypertension Father     Hypertension Paternal Grandmother     Social History Social History[1]   Allergies   Patient has no known allergies.   Review of Systems Review of Systems Per HPI  Physical Exam Triage Vital Signs ED Triage Vitals  Encounter Vitals Group     BP 06/03/24 1514 127/74     Girls Systolic BP Percentile --      Girls Diastolic BP Percentile --      Boys Systolic BP Percentile --      Boys Diastolic BP Percentile --      Pulse Rate 06/03/24 1514 92     Resp 06/03/24 1514 18     Temp 06/03/24 1514 98.1 F (36.7 C)     Temp Source 06/03/24 1514 Oral     SpO2 06/03/24 1514 97 %     Weight --      Height --      Head Circumference --      Peak Flow --      Pain Score 06/03/24 1517 2     Pain Loc --      Pain Education --      Exclude from Growth Chart --    No data found.  Updated Vital Signs BP 127/74 (BP Location: Right Arm)   Pulse 92   Temp 98.1 F (36.7 C) (Oral)   Resp 18   SpO2 97%   Visual Acuity Right Eye Distance:  Left Eye Distance:   Bilateral Distance:    Right Eye Near:   Left Eye Near:    Bilateral Near:     Physical Exam Vitals and nursing note reviewed.  Constitutional:      Appearance: Normal appearance.  HENT:     Head: Atraumatic.     Mouth/Throat:     Mouth: Mucous membranes are moist.     Comments: Gingival erythema, edema to the left posterior lower molar.  Left-sided facial edema in this area.  Oral airway patent Eyes:     Extraocular Movements: Extraocular movements intact.     Conjunctiva/sclera: Conjunctivae normal.  Cardiovascular:     Rate and Rhythm: Normal rate.  Pulmonary:     Effort: Pulmonary effort is normal.  Musculoskeletal:        General: Normal range of motion.     Cervical back: Normal range of motion and neck supple.  Skin:    General: Skin is warm and dry.  Neurological:     General: No focal deficit present.     Mental Status: He is oriented to person, place, and time.  Psychiatric:        Mood and  Affect: Mood normal.        Thought Content: Thought content normal.        Judgment: Judgment normal.      UC Treatments / Results  Labs (all labs ordered are listed, but only abnormal results are displayed) Labs Reviewed - No data to display  EKG   Radiology No results found.  Procedures Procedures (including critical care time)  Medications Ordered in UC Medications - No data to display  Initial Impression / Assessment and Plan / UC Course  I have reviewed the triage vital signs and the nursing notes.  Pertinent labs & imaging results that were available during my care of the patient were reviewed by me and considered in my medical decision making (see chart for details).     Suspect dental infection, will treat with Augmentin , viscous lidocaine , close dental follow-up.  Over-the-counter pain relievers as needed.  Return for worsening or unresolving symptoms.  Final Clinical Impressions(s) / UC Diagnoses   Final diagnoses:  Dental infection  Facial edema     Discharge Instructions      In addition to the prescribed medications, you may take over-the-counter pain relievers as needed, drink plenty of water, follow-up with a dentist as soon as possible.    ED Prescriptions     Medication Sig Dispense Auth. Provider   amoxicillin -clavulanate (AUGMENTIN ) 875-125 MG tablet Take 1 tablet by mouth every 12 (twelve) hours. 14 tablet Stuart Vernell Norris, PA-C   lidocaine  (XYLOCAINE ) 2 % solution Use as directed 10 mLs in the mouth or throat every 3 (three) hours as needed. 100 mL Stuart Vernell Norris, [place]      PDMP not reviewed this encounter.    [1]  Social History Tobacco Use   Smoking status: Never   Smokeless tobacco: Never   Tobacco comments:    Never smoke 08/18/22  Vaping Use   Vaping status: Never Used  Substance Use Topics   Alcohol use: Yes    Comment: rare   Drug use: No     Stuart Vernell Norris, PA-C 06/03/24 1556  "

## 2024-06-03 NOTE — Discharge Instructions (Signed)
 In addition to the prescribed medications, you may take over-the-counter pain relievers as needed, drink plenty of water, follow-up with a dentist as soon as possible.

## 2024-06-18 ENCOUNTER — Encounter: Payer: Self-pay | Admitting: Physician Assistant

## 2024-06-18 ENCOUNTER — Ambulatory Visit: Admitting: Physician Assistant

## 2024-06-18 ENCOUNTER — Other Ambulatory Visit: Payer: Self-pay | Admitting: Physician Assistant

## 2024-06-18 VITALS — BP 122/72 | HR 101 | Temp 98.7°F | Ht 70.0 in | Wt 185.0 lb

## 2024-06-18 DIAGNOSIS — I480 Paroxysmal atrial fibrillation: Secondary | ICD-10-CM

## 2024-06-18 DIAGNOSIS — K219 Gastro-esophageal reflux disease without esophagitis: Secondary | ICD-10-CM | POA: Insufficient documentation

## 2024-06-18 MED ORDER — PANTOPRAZOLE SODIUM 40 MG PO TBEC: 40.0000 mg | DELAYED_RELEASE_TABLET | Freq: Every day | ORAL | 2 refills | Status: AC | PRN

## 2024-06-18 MED ORDER — METOPROLOL SUCCINATE ER 25 MG PO TB24: ORAL_TABLET | ORAL | 1 refills | Status: AC

## 2024-06-18 NOTE — Assessment & Plan Note (Signed)
 Refill metoprolol  which seems to work nicely for as needed use.  Patient is not in A-fib at the time of our exam

## 2024-06-18 NOTE — Progress Notes (Signed)
 "   Date:  06/18/2024   Name:  Peter Durham   DOB:  23-Jul-1995   MRN:  [id number]   Chief Complaint: Hypertension (High reading this morning, took 2 BP pills instead of one, uses arm cuff ) and Gastroesophageal Reflux (X 1 week, Abdominal pain / discomfort, getting worse, medication helps)  HPI  Peter Durham is a very pleasant 29 year old male with a history of paroxysmal A-fib, neutropenia, and intermittent GERD who presents new to the practice today to establish care.  He uses both metoprolol  and pantoprazole  on an as needed basis only.    Paroxysmal A-fib has been documented on cardiac monitor and also captured on an EKG from 2024.  Patient states that when this occurs, blood pressure goes up as well, and he knows to take his metoprolol .  Typically uses metoprolol  about once per corder by his estimation.  Acid reflux bothers him only on occasion, pantoprazole  helps.  Has not yet identified any specific food triggers.  Will be seeing hematology again in June for ongoing monitoring of neutropenia.  Medication list has been reviewed and updated.  Active Medications[1]   Review of Systems  Patient Active Problem List   Diagnosis Date Noted   Gastroesophageal reflux disease 06/18/2024   Paroxysmal atrial fibrillation (HCC) 08/18/2022   History of polyuria 06/23/2021   Neutropenia 01/07/2021    Allergies[2]  Immunization History  Administered Date(s) Administered   DTaP 01/20/1996, 03/16/1996, 07/03/1996, 02/22/1997, 01/17/2002   HIB (PRP-OMP) 01/20/1996, 03/16/1996, 07/03/1996, 02/22/1997   Hep B, Unspecified 03-08-1996   Hepatitis B 12/01/1995, 07/03/1996   IPV 01/20/1996, 03/16/1996, 02/22/1997   MMR 02/22/1997, 01/17/2002   Meningococcal Conjugate 02/28/2008, 12/05/2013   Meningococcal polysaccharide vaccine (MPSV4) 02/28/2008   Moderna Sars-Covid-2 Vaccination 10/03/2019, 11/03/2019   PPD Test 08/01/2023   Polio, Unspecified 01/20/1996, 03/16/1996, 02/22/1997, 01/17/2002    Tdap 01/11/2007, 11/23/2021   Varicella 10/19/1996    History reviewed. No pertinent surgical history.  Social History[3]  Family History  Problem Relation Age of Onset   Thyroid  disease Mother    Hypertension Father    Hypertension Paternal Grandmother    AAA (abdominal aortic aneurysm) Paternal Grandmother    AAA (abdominal aortic aneurysm) Paternal Grandfather         06/18/2024    3:16 PM  GAD 7 : Generalized Anxiety Score  Nervous, Anxious, on Edge 0  Control/stop worrying 0  Worry too much - different things 0  Trouble relaxing 0  Restless 0  Easily annoyed or irritable 0  Afraid - awful might happen 0  Total GAD 7 Score 0  Anxiety Difficulty Not difficult at all       06/18/2024    3:15 PM  Depression screen PHQ 2/9  Decreased Interest 0  Down, Depressed, Hopeless 0  PHQ - 2 Score 0  Altered sleeping 0  Tired, decreased energy 0  Change in appetite 0  Feeling bad or failure about yourself  0  Trouble concentrating 0  Moving slowly or fidgety/restless 0  Suicidal thoughts 0  PHQ-9 Score 0  Difficult doing work/chores Not difficult at all    BP Readings from Last 3 Encounters:  06/18/24 122/72  06/03/24 127/74  11/22/23 118/68    Wt Readings from Last 3 Encounters:  06/18/24 185 lb (83.9 kg)  11/22/23 180 lb (81.6 kg)  10/01/22 187 lb 6.3 oz (85 kg)    BP 122/72 (Cuff Size: Normal)   Pulse (!) 101   Temp 98.7 F (37.1 C)  Ht 5' 10 (1.778 m)   Wt 185 lb (83.9 kg)   SpO2 98%   BMI 26.54 kg/m   Physical Exam Vitals and nursing note reviewed.  Constitutional:      Appearance: Normal appearance.  Cardiovascular:     Rate and Rhythm: Normal rate and regular rhythm.     Heart sounds: No murmur heard.    No friction rub. No gallop.  Pulmonary:     Effort: Pulmonary effort is normal.     Breath sounds: Normal breath sounds.  Abdominal:     General: There is no distension.  Musculoskeletal:        General: Normal range of motion.   Skin:    General: Skin is warm and dry.  Neurological:     Mental Status: He is alert and oriented to person, place, and time.     Gait: Gait is intact.  Psychiatric:        Mood and Affect: Mood and affect normal.     Recent Labs     Component Value Date/Time   NA 136 10/01/2022 0052   NA 142 08/23/2022 1011   K 3.6 10/01/2022 0052   CL 97 (L) 10/01/2022 0052   CO2 27 10/01/2022 0052   GLUCOSE 111 (H) 10/01/2022 0052   BUN 7 10/01/2022 0052   BUN 10 08/23/2022 1011   CREATININE 1.24 10/01/2022 0052   CALCIUM 9.6 10/01/2022 0052   PROT 7.5 08/23/2022 1011   ALBUMIN 4.8 08/23/2022 1011   AST 19 08/23/2022 1011   ALT 19 08/23/2022 1011   ALKPHOS 84 08/23/2022 1011   BILITOT 1.4 (H) 08/23/2022 1011   GFRNONAA >60 10/01/2022 0052    Lab Results  Component Value Date   WBC 4.3 10/01/2022   HGB 15.3 10/01/2022   HCT 45.0 10/01/2022   MCV 81.5 10/01/2022   PLT 152 10/01/2022   No results found for: HGBA1C Lab Results  Component Value Date   CHOL 182 08/23/2022   HDL 52 08/23/2022   LDLCALC 119 (H) 08/23/2022   TRIG 58 08/23/2022   CHOLHDL 3.5 08/23/2022   Lab Results  Component Value Date   TSH 2.934 08/06/2022      Assessment and Plan:  Paroxysmal atrial fibrillation (HCC) Assessment & Plan: Refill metoprolol  which seems to work nicely for as needed use.  Patient is not in A-fib at the time of our exam  Orders: -     Metoprolol  Succinate ER; Take once a day if you develop palpitations again.  Dispense: 30 tablet; Refill: 1  Gastroesophageal reflux disease, unspecified whether esophagitis present Assessment & Plan: Refill pantoprazole  for as needed use  Orders: -     Pantoprazole  Sodium; Take 1 tablet (40 mg total) by mouth daily as needed.  Dispense: 30 tablet; Refill: 2     Follow-up 9 months CPE with any routine labs that were not completed by hematology   Rolan Hoyle, PA-C, DMSc, DipACLM, Nutritionist [facility]  [facility] [phone]      [1]  Current Meds  Medication Sig   aspirin EC 81 MG tablet Take 81 mg by mouth as needed. Swallow whole.   Calcium Carbonate Antacid (TUMS PO) Take by mouth as needed.   cholecalciferol 25 MCG (1000 UT) tablet Take 1,000 Units by mouth daily. Take 1/2 gummy daily   lidocaine  (XYLOCAINE ) 2 % solution Use as directed 10 mLs in the mouth or throat every 3 (  three) hours as needed.   Multiple Vitamin (MULTIVITAMIN ADULT PO) Take 1 tablet by mouth daily.   vitamin C (ASCORBIC ACID) 250 MG tablet Take 250 mg by mouth daily.   [DISCONTINUED] metoprolol  succinate (TOPROL -XL) 25 MG 24 hr tablet Take once a day if you develop palpitations again.   [DISCONTINUED] pantoprazole  (PROTONIX ) 40 MG tablet Take 40 mg by mouth as needed.  [2] No Known Allergies [3]  Social History Tobacco Use   Smoking status: Never   Smokeless tobacco: Never   Tobacco comments:    Never smoke 08/18/22  Vaping Use   Vaping status: Never Used  Substance Use Topics   Alcohol use: Yes    Comment: rare   Drug use: No   "

## 2024-06-18 NOTE — Assessment & Plan Note (Signed)
 Refill pantoprazole  for as needed use

## 2024-06-19 NOTE — Telephone Encounter (Signed)
 Rx 06/18/24 #30 1RF- duplicate request Requested Prescriptions  Pending Prescriptions Disp Refills   metoprolol  succinate (TOPROL -XL) 25 MG 24 hr tablet [Pharmacy Med Name: METOPROLOL  ER SUCCINATE 25MG  TABS] 90 tablet     Sig: TAKE 1 TABLET BY MOUTH ONCE DAILY, IF YOU DVELOP PALPITATIONS AGAIN     Cardiovascular:  Beta Blockers Passed - 06/19/2024  3:28 PM      Passed - Last BP in normal range    BP Readings from Last 1 Encounters:  06/18/24 122/72         Passed - Last Heart Rate in normal range    Pulse Readings from Last 1 Encounters:  06/18/24 (!) 101         Passed - Valid encounter within last 6 months    Recent Outpatient Visits           Yesterday Paroxysmal atrial fibrillation [facility])   [facility] at [facility], [place], [place]

## 2025-03-18 ENCOUNTER — Encounter: Admitting: Physician Assistant
